# Patient Record
Sex: Female | Born: 1950
Health system: Southern US, Community
[De-identification: ages and names within clinical notes are randomized; demographics above are authoritative.]

## PROBLEM LIST (undated history)

## (undated) DIAGNOSIS — Z8489 Family history of other specified conditions: Secondary | ICD-10-CM

## (undated) DIAGNOSIS — I739 Peripheral vascular disease, unspecified: Secondary | ICD-10-CM

## (undated) DIAGNOSIS — I10 Essential (primary) hypertension: Secondary | ICD-10-CM

## (undated) HISTORY — PX: CHOLECYSTECTOMY: SHX55

---

## 2014-07-16 ENCOUNTER — Emergency Department (HOSPITAL_COMMUNITY)
Admission: EM | Admit: 2014-07-16 | Discharge: 2014-07-16 | Disposition: A | Discharge status: Home / Self Care | Payer: BLUE CROSS/BLUE SHIELD | Source: Home / Self Care | Attending: Emergency Medicine | Admitting: Emergency Medicine

## 2014-07-16 ENCOUNTER — Emergency Department (HOSPITAL_COMMUNITY): Payer: BLUE CROSS/BLUE SHIELD | Admitting: Anesthesiology

## 2014-07-16 ENCOUNTER — Encounter (HOSPITAL_COMMUNITY): Payer: Self-pay | Admitting: Oncology

## 2014-07-16 ENCOUNTER — Inpatient Hospital Stay (HOSPITAL_COMMUNITY)
Admission: EM | Admit: 2014-07-16 | Discharge: 2014-07-21 | DRG: 580 | Disposition: A | Discharge status: Home Health | Payer: BLUE CROSS/BLUE SHIELD | Attending: Vascular Surgery | Admitting: Vascular Surgery

## 2014-07-16 ENCOUNTER — Encounter (HOSPITAL_COMMUNITY)
Admission: EM | Disposition: A | Discharge status: Home Health | Payer: Self-pay | Source: Home / Self Care | Attending: Vascular Surgery

## 2014-07-16 ENCOUNTER — Emergency Department (HOSPITAL_COMMUNITY): Payer: BLUE CROSS/BLUE SHIELD

## 2014-07-16 ENCOUNTER — Other Ambulatory Visit (HOSPITAL_COMMUNITY): Payer: Self-pay

## 2014-07-16 ENCOUNTER — Encounter (HOSPITAL_COMMUNITY): Payer: Self-pay | Admitting: Emergency Medicine

## 2014-07-16 DIAGNOSIS — S70312A Abrasion, left thigh, initial encounter: Secondary | ICD-10-CM

## 2014-07-16 DIAGNOSIS — Y998 Other external cause status: Secondary | ICD-10-CM

## 2014-07-16 DIAGNOSIS — S90511A Abrasion, right ankle, initial encounter: Secondary | ICD-10-CM

## 2014-07-16 DIAGNOSIS — M7981 Nontraumatic hematoma of soft tissue: Secondary | ICD-10-CM | POA: Diagnosis not present

## 2014-07-16 DIAGNOSIS — Z9889 Other specified postprocedural states: Secondary | ICD-10-CM | POA: Diagnosis not present

## 2014-07-16 DIAGNOSIS — S80812A Abrasion, left lower leg, initial encounter: Secondary | ICD-10-CM | POA: Insufficient documentation

## 2014-07-16 DIAGNOSIS — Y9389 Activity, other specified: Secondary | ICD-10-CM | POA: Insufficient documentation

## 2014-07-16 DIAGNOSIS — Y9289 Other specified places as the place of occurrence of the external cause: Secondary | ICD-10-CM | POA: Insufficient documentation

## 2014-07-16 DIAGNOSIS — S81012A Laceration without foreign body, left knee, initial encounter: Secondary | ICD-10-CM

## 2014-07-16 DIAGNOSIS — I10 Essential (primary) hypertension: Secondary | ICD-10-CM | POA: Insufficient documentation

## 2014-07-16 DIAGNOSIS — S7010XA Contusion of unspecified thigh, initial encounter: Secondary | ICD-10-CM | POA: Diagnosis present

## 2014-07-16 DIAGNOSIS — D62 Acute posthemorrhagic anemia: Secondary | ICD-10-CM | POA: Diagnosis present

## 2014-07-16 DIAGNOSIS — S7012XA Contusion of left thigh, initial encounter: Secondary | ICD-10-CM | POA: Diagnosis present

## 2014-07-16 DIAGNOSIS — I959 Hypotension, unspecified: Secondary | ICD-10-CM | POA: Diagnosis present

## 2014-07-16 DIAGNOSIS — S75092A Other specified injury of femoral artery, left leg, initial encounter: Secondary | ICD-10-CM | POA: Diagnosis not present

## 2014-07-16 DIAGNOSIS — Z79899 Other long term (current) drug therapy: Secondary | ICD-10-CM | POA: Diagnosis not present

## 2014-07-16 DIAGNOSIS — W19XXXA Unspecified fall, initial encounter: Secondary | ICD-10-CM

## 2014-07-16 DIAGNOSIS — R58 Hemorrhage, not elsewhere classified: Secondary | ICD-10-CM

## 2014-07-16 DIAGNOSIS — S81022A Laceration with foreign body, left knee, initial encounter: Secondary | ICD-10-CM | POA: Diagnosis present

## 2014-07-16 DIAGNOSIS — T07XXXA Unspecified multiple injuries, initial encounter: Secondary | ICD-10-CM

## 2014-07-16 DIAGNOSIS — S81029A Laceration with foreign body, unspecified knee, initial encounter: Secondary | ICD-10-CM | POA: Diagnosis not present

## 2014-07-16 DIAGNOSIS — I70202 Unspecified atherosclerosis of native arteries of extremities, left leg: Secondary | ICD-10-CM | POA: Diagnosis not present

## 2014-07-16 HISTORY — DX: Essential (primary) hypertension: I10

## 2014-07-16 HISTORY — PX: INSERTION OF ILIAC STENT: SHX6256

## 2014-07-16 LAB — CBC
HCT: 34.6 % — ABNORMAL LOW (ref 36.0–46.0)
HCT: 31.4 % — ABNORMAL LOW (ref 36.0–46.0)
Hemoglobin: 11.7 g/dL — ABNORMAL LOW (ref 12.0–15.0)
Hemoglobin: 10.6 g/dL — ABNORMAL LOW (ref 12.0–15.0)
MCH: 29.8 pg (ref 26.0–34.0)
MCH: 29.9 pg (ref 26.0–34.0)
MCHC: 33.8 g/dL (ref 30.0–36.0)
MCHC: 33.8 g/dL (ref 30.0–36.0)
MCV: 88.3 fL (ref 78.0–100.0)
MCV: 88.5 fL (ref 78.0–100.0)
Platelets: 242 10*3/uL (ref 150–400)
Platelets: 228 10*3/uL (ref 150–400)
RBC: 3.92 MIL/uL (ref 3.87–5.11)
RBC: 3.55 MIL/uL — ABNORMAL LOW (ref 3.87–5.11)
RDW: 13 % (ref 11.5–15.5)
RDW: 13 % (ref 11.5–15.5)
WBC: 13.1 10*3/uL — AB (ref 4.0–10.5)
WBC: 11.4 10*3/uL — ABNORMAL HIGH (ref 4.0–10.5)

## 2014-07-16 LAB — PROTIME-INR
INR: 1.13 (ref 0.00–1.49)
Prothrombin Time: 14.7 seconds (ref 11.6–15.2)

## 2014-07-16 LAB — CBC WITH DIFFERENTIAL/PLATELET
Basophils Absolute: 0 10*3/uL (ref 0.0–0.1)
Basophils Relative: 0 % (ref 0–1)
Eosinophils Absolute: 0 10*3/uL (ref 0.0–0.7)
Eosinophils Relative: 0 % (ref 0–5)
HCT: 37 % (ref 36.0–46.0)
Hemoglobin: 12.3 g/dL (ref 12.0–15.0)
LYMPHS ABS: 1.3 10*3/uL (ref 0.7–4.0)
LYMPHS PCT: 9 % — AB (ref 12–46)
MCH: 29.2 pg (ref 26.0–34.0)
MCHC: 33.2 g/dL (ref 30.0–36.0)
MCV: 87.9 fL (ref 78.0–100.0)
Monocytes Absolute: 1 10*3/uL (ref 0.1–1.0)
Monocytes Relative: 7 % (ref 3–12)
NEUTROS PCT: 84 % — AB (ref 43–77)
Neutro Abs: 12.5 10*3/uL — ABNORMAL HIGH (ref 1.7–7.7)
Platelets: 243 10*3/uL (ref 150–400)
RBC: 4.21 MIL/uL (ref 3.87–5.11)
RDW: 12.7 % (ref 11.5–15.5)
WBC: 14.7 10*3/uL — AB (ref 4.0–10.5)

## 2014-07-16 LAB — CREATININE, SERUM
Creatinine, Ser: 0.74 mg/dL (ref 0.44–1.00)
GFR calc Af Amer: >60 mL/min (ref >60)

## 2014-07-16 LAB — MRSA PCR SCREENING: MRSA BY PCR: NEGATIVE

## 2014-07-16 LAB — BASIC METABOLIC PANEL
ANION GAP: 10 (ref 5–15)
BUN: 18 mg/dL (ref 6–20)
CHLORIDE: 107 mmol/L (ref 101–111)
CO2: 22 mmol/L (ref 22–32)
Calcium: 8.9 mg/dL (ref 8.9–10.3)
Creatinine, Ser: 0.84 mg/dL (ref 0.44–1.00)
GFR calc Af Amer: >60 mL/min (ref >60)
Glucose, Bld: 181 mg/dL — ABNORMAL HIGH (ref 65–99)
POTASSIUM: 3.1 mmol/L — AB (ref 3.5–5.1)
SODIUM: 139 mmol/L (ref 135–145)

## 2014-07-16 SURGERY — INSERTION, STENT, ARTERY, ILIAC
Anesthesia: General | Site: Leg Upper | Laterality: Left

## 2014-07-16 MED ORDER — PROPOFOL 10 MG/ML IV BOLUS
INTRAVENOUS | Status: DC | PRN
  Administered 2014-07-16: 120 mg via INTRAVENOUS

## 2014-07-16 MED ORDER — FLEET ENEMA 7-19 GM/118ML RE ENEM: 1.0000 | ENEMA | Freq: Once | RECTAL | Status: AC | PRN

## 2014-07-16 MED ORDER — CEFAZOLIN SODIUM-DEXTROSE 2-3 GM-% IV SOLR
INTRAVENOUS | Status: AC
  Filled 2014-07-16: qty 50

## 2014-07-16 MED ORDER — PROPOFOL 10 MG/ML IV BOLUS
INTRAVENOUS | Status: AC
  Filled 2014-07-16: qty 20

## 2014-07-16 MED ORDER — SODIUM CHLORIDE 0.9 % IV SOLN
500.0000 mL | Freq: Once | INTRAVENOUS | Status: AC | PRN
  Administered 2014-07-16 (×2): 500 mL via INTRAVENOUS

## 2014-07-16 MED ORDER — PHENOL 1.4 % MT LIQD: 1.0000 | OROMUCOSAL | Status: DC | PRN

## 2014-07-16 MED ORDER — ENOXAPARIN SODIUM 40 MG/0.4ML ~~LOC~~ SOLN
40.0000 mg | SUBCUTANEOUS | Status: DC
  Administered 2014-07-17 – 2014-07-21 (×4): 40 mg via SUBCUTANEOUS
  Filled 2014-07-16 (×6): qty 0.4

## 2014-07-16 MED ORDER — HEPARIN SODIUM (PORCINE) 1000 UNIT/ML IJ SOLN
INTRAMUSCULAR | Status: DC | PRN
  Administered 2014-07-16: 5000 [IU] via INTRAVENOUS

## 2014-07-16 MED ORDER — ROCURONIUM BROMIDE 50 MG/5ML IV SOLN
INTRAVENOUS | Status: AC
  Filled 2014-07-16: qty 1

## 2014-07-16 MED ORDER — IODIXANOL 320 MG/ML IV SOLN
INTRAVENOUS | Status: DC | PRN
  Administered 2014-07-16: 43 mL via INTRAVENOUS

## 2014-07-16 MED ORDER — MAGNESIUM SULFATE 2 GM/50ML IV SOLN
2.0000 g | Freq: Every day | INTRAVENOUS | Status: DC | PRN
  Filled 2014-07-16: qty 50

## 2014-07-16 MED ORDER — LIDOCAINE HCL (CARDIAC) 20 MG/ML IV SOLN
INTRAVENOUS | Status: AC
  Filled 2014-07-16: qty 5

## 2014-07-16 MED ORDER — FENTANYL CITRATE (PF) 100 MCG/2ML IJ SOLN
100.0000 ug | INTRAMUSCULAR | Status: AC
  Administered 2014-07-16: 100 ug via INTRAVENOUS
  Filled 2014-07-16: qty 2

## 2014-07-16 MED ORDER — IOHEXOL 350 MG/ML SOLN
100.0000 mL | Freq: Once | INTRAVENOUS | Status: AC | PRN
  Administered 2014-07-16: 100 mL via INTRAVENOUS

## 2014-07-16 MED ORDER — SODIUM CHLORIDE 0.9 % IV SOLN
Freq: Once | INTRAVENOUS | Status: AC
  Administered 2014-07-16: 06:00:00 via INTRAVENOUS

## 2014-07-16 MED ORDER — SODIUM CHLORIDE 0.9 % IV SOLN
INTRAVENOUS | Status: DC
  Administered 2014-07-16 – 2014-07-17 (×3): via INTRAVENOUS

## 2014-07-16 MED ORDER — HYDRALAZINE HCL 20 MG/ML IJ SOLN: 5.0000 mg | INTRAMUSCULAR | Status: DC | PRN

## 2014-07-16 MED ORDER — BACITRACIN ZINC 500 UNIT/GM EX OINT
TOPICAL_OINTMENT | CUTANEOUS | Status: AC
Start: 2014-07-16 — End: 2014-07-16
  Administered 2014-07-16: 1 via TOPICAL
  Filled 2014-07-16: qty 0.9

## 2014-07-16 MED ORDER — DEXAMETHASONE SODIUM PHOSPHATE 4 MG/ML IJ SOLN
INTRAMUSCULAR | Status: AC
  Filled 2014-07-16: qty 1

## 2014-07-16 MED ORDER — FENTANYL CITRATE (PF) 250 MCG/5ML IJ SOLN
INTRAMUSCULAR | Status: AC
  Filled 2014-07-16: qty 5

## 2014-07-16 MED ORDER — HYDROMORPHONE HCL 1 MG/ML IJ SOLN
INTRAMUSCULAR | Status: AC
  Filled 2014-07-16: qty 1

## 2014-07-16 MED ORDER — ALUM & MAG HYDROXIDE-SIMETH 200-200-20 MG/5ML PO SUSP
15.0000 mL | ORAL | Status: DC | PRN
Start: 2014-07-16 — End: 2014-07-21

## 2014-07-16 MED ORDER — PROTAMINE SULFATE 10 MG/ML IV SOLN
INTRAVENOUS | Status: DC | PRN
  Administered 2014-07-16: 30 mg via INTRAVENOUS

## 2014-07-16 MED ORDER — GUAIFENESIN-DM 100-10 MG/5ML PO SYRP
15.0000 mL | ORAL_SOLUTION | ORAL | Status: DC | PRN
Start: 2014-07-16 — End: 2014-07-21

## 2014-07-16 MED ORDER — ONDANSETRON HCL 4 MG/2ML IJ SOLN
4.0000 mg | Freq: Four times a day (QID) | INTRAMUSCULAR | Status: DC | PRN
  Administered 2014-07-20: 4 mg via INTRAVENOUS
  Filled 2014-07-16: qty 2

## 2014-07-16 MED ORDER — HYDROMORPHONE HCL 1 MG/ML IJ SOLN
1.0000 mg | Freq: Once | INTRAMUSCULAR | Status: AC
  Administered 2014-07-16: 1 mg via INTRAVENOUS

## 2014-07-16 MED ORDER — BACITRACIN ZINC 500 UNIT/GM EX OINT
1.0000 "application " | TOPICAL_OINTMENT | Freq: Two times a day (BID) | CUTANEOUS | Status: DC
  Administered 2014-07-16: 1 via TOPICAL

## 2014-07-16 MED ORDER — ARTIFICIAL TEARS OP OINT
TOPICAL_OINTMENT | OPHTHALMIC | Status: AC
  Filled 2014-07-16: qty 3.5

## 2014-07-16 MED ORDER — ACETAMINOPHEN 650 MG RE SUPP
325.0000 mg | RECTAL | Status: DC | PRN
Start: 2014-07-16 — End: 2014-07-21

## 2014-07-16 MED ORDER — METOPROLOL TARTRATE 1 MG/ML IV SOLN: 2.0000 mg | INTRAVENOUS | Status: DC | PRN

## 2014-07-16 MED ORDER — POLYETHYLENE GLYCOL 3350 17 G PO PACK
17.0000 g | PACK | Freq: Every day | ORAL | Status: DC | PRN
  Filled 2014-07-16: qty 1

## 2014-07-16 MED ORDER — SODIUM CHLORIDE 0.9 % IR SOLN
Status: DC | PRN
  Administered 2014-07-16: 3000 mL

## 2014-07-16 MED ORDER — EPHEDRINE SULFATE 50 MG/ML IJ SOLN
INTRAMUSCULAR | Status: AC
  Filled 2014-07-16: qty 1

## 2014-07-16 MED ORDER — LIDOCAINE HCL (CARDIAC) 20 MG/ML IV SOLN
INTRAVENOUS | Status: DC | PRN
  Administered 2014-07-16: 40 mg via INTRAVENOUS

## 2014-07-16 MED ORDER — SODIUM CHLORIDE 0.9 % IR SOLN
Status: DC | PRN
  Administered 2014-07-16: 500 mL

## 2014-07-16 MED ORDER — ACETAMINOPHEN 325 MG PO TABS
325.0000 mg | ORAL_TABLET | ORAL | Status: DC | PRN
  Administered 2014-07-19: 325 mg via ORAL
  Filled 2014-07-16: qty 2

## 2014-07-16 MED ORDER — FENTANYL CITRATE (PF) 250 MCG/5ML IJ SOLN
INTRAMUSCULAR | Status: DC | PRN
  Administered 2014-07-16 (×2): 50 ug via INTRAVENOUS

## 2014-07-16 MED ORDER — 0.9 % SODIUM CHLORIDE (POUR BTL) OPTIME
TOPICAL | Status: DC | PRN
  Administered 2014-07-16: 2000 mL

## 2014-07-16 MED ORDER — SODIUM CHLORIDE 0.9 % IJ SOLN
INTRAMUSCULAR | Status: AC
  Filled 2014-07-16: qty 10

## 2014-07-16 MED ORDER — HYDROMORPHONE HCL 1 MG/ML IJ SOLN: 0.2500 mg | INTRAMUSCULAR | Status: DC | PRN

## 2014-07-16 MED ORDER — METOPROLOL TARTRATE 25 MG PO TABS
25.0000 mg | ORAL_TABLET | Freq: Two times a day (BID) | ORAL | Status: DC
  Administered 2014-07-16: 25 mg via ORAL
  Filled 2014-07-16 (×4): qty 1

## 2014-07-16 MED ORDER — HYDROMORPHONE HCL 1 MG/ML IJ SOLN
1.0000 mg | Freq: Once | INTRAMUSCULAR | Status: AC
  Administered 2014-07-16: 1 mg via INTRAVENOUS
  Filled 2014-07-16: qty 1

## 2014-07-16 MED ORDER — DEXTROSE 5 % IV SOLN
10.0000 mg | INTRAVENOUS | Status: DC | PRN
  Administered 2014-07-16: 60 ug/min via INTRAVENOUS

## 2014-07-16 MED ORDER — FENTANYL CITRATE (PF) 100 MCG/2ML IJ SOLN
100.0000 ug | Freq: Once | INTRAMUSCULAR | Status: AC
  Administered 2014-07-16: 100 ug via INTRAVENOUS
  Filled 2014-07-16: qty 2

## 2014-07-16 MED ORDER — DOCUSATE SODIUM 100 MG PO CAPS
100.0000 mg | ORAL_CAPSULE | Freq: Every day | ORAL | Status: DC
  Administered 2014-07-17 – 2014-07-21 (×4): 100 mg via ORAL
  Filled 2014-07-16 (×5): qty 1

## 2014-07-16 MED ORDER — MORPHINE SULFATE 2 MG/ML IJ SOLN
2.0000 mg | INTRAMUSCULAR | Status: DC | PRN
  Administered 2014-07-16 – 2014-07-17 (×3): 2 mg via INTRAVENOUS
  Administered 2014-07-17 (×2): 4 mg via INTRAVENOUS
  Administered 2014-07-17 (×2): 2 mg via INTRAVENOUS
  Administered 2014-07-17 – 2014-07-18 (×2): 4 mg via INTRAVENOUS
  Filled 2014-07-16: qty 2
  Filled 2014-07-16: qty 1
  Filled 2014-07-16: qty 2
  Filled 2014-07-16: qty 1
  Filled 2014-07-16: qty 2
  Filled 2014-07-16: qty 1
  Filled 2014-07-16: qty 2
  Filled 2014-07-16 (×2): qty 1

## 2014-07-16 MED ORDER — OXYCODONE HCL 5 MG PO TABS
5.0000 mg | ORAL_TABLET | ORAL | Status: DC | PRN
  Administered 2014-07-18: 5 mg via ORAL
  Administered 2014-07-21: 10 mg via ORAL
  Filled 2014-07-16: qty 2
  Filled 2014-07-16: qty 1

## 2014-07-16 MED ORDER — LACTATED RINGERS IV SOLN
INTRAVENOUS | Status: DC | PRN
  Administered 2014-07-16 (×2): via INTRAVENOUS

## 2014-07-16 MED ORDER — LABETALOL HCL 5 MG/ML IV SOLN
10.0000 mg | INTRAVENOUS | Status: DC | PRN
  Filled 2014-07-16: qty 4

## 2014-07-16 MED ORDER — LISINOPRIL 5 MG PO TABS
5.0000 mg | ORAL_TABLET | Freq: Every day | ORAL | Status: DC
  Administered 2014-07-16: 5 mg via ORAL
  Filled 2014-07-16 (×2): qty 1

## 2014-07-16 MED ORDER — DEXAMETHASONE SODIUM PHOSPHATE 4 MG/ML IJ SOLN
INTRAMUSCULAR | Status: DC | PRN
  Administered 2014-07-16: 8 mg via INTRAVENOUS

## 2014-07-16 MED ORDER — BISACODYL 10 MG RE SUPP: 10.0000 mg | Freq: Every day | RECTAL | Status: DC | PRN

## 2014-07-16 MED ORDER — POTASSIUM CHLORIDE CRYS ER 20 MEQ PO TBCR: 20.0000 meq | EXTENDED_RELEASE_TABLET | Freq: Every day | ORAL | Status: DC | PRN

## 2014-07-16 MED ORDER — LIDOCAINE HCL (PF) 1 % IJ SOLN
2.0000 mL | Freq: Once | INTRAMUSCULAR | Status: AC
  Administered 2014-07-16: 2 mL
  Filled 2014-07-16: qty 5

## 2014-07-16 MED ORDER — ESMOLOL HCL 10 MG/ML IV SOLN
INTRAVENOUS | Status: DC | PRN
  Administered 2014-07-16: 30 mg via INTRAVENOUS

## 2014-07-16 MED ORDER — CEFAZOLIN SODIUM-DEXTROSE 2-3 GM-% IV SOLR
INTRAVENOUS | Status: DC | PRN
  Administered 2014-07-16: 2 g via INTRAVENOUS

## 2014-07-16 MED ORDER — ROCURONIUM BROMIDE 100 MG/10ML IV SOLN
INTRAVENOUS | Status: DC | PRN
  Administered 2014-07-16: 50 mg via INTRAVENOUS

## 2014-07-16 MED ORDER — DEXTROSE 5 % IV SOLN
1.5000 g | Freq: Two times a day (BID) | INTRAVENOUS | Status: AC
  Administered 2014-07-16 – 2014-07-17 (×2): 1.5 g via INTRAVENOUS
  Filled 2014-07-16 (×2): qty 1.5

## 2014-07-16 MED ORDER — ONDANSETRON HCL 4 MG/2ML IJ SOLN
INTRAMUSCULAR | Status: AC
  Filled 2014-07-16: qty 2

## 2014-07-16 MED ORDER — EPHEDRINE SULFATE 50 MG/ML IJ SOLN
INTRAMUSCULAR | Status: DC | PRN
  Administered 2014-07-16: 10 mg via INTRAVENOUS

## 2014-07-16 SURGICAL SUPPLY — 105 items
BAG BANDED W/RUBBER/TAPE 36X54 (MISCELLANEOUS) ×3 IMPLANT
BAG ISOLATION DRAPE 18X18 (DRAPES) ×2 IMPLANT
BALLN MUSTANG 4.0X40 75 (BALLOONS) ×3
BALLN MUSTANG 4X20X135 (BALLOONS) ×3
BALLOON MUSTANG 4.0X40 75 (BALLOONS) ×2 IMPLANT
BALLOON MUSTANG 4X20X135 (BALLOONS) ×2 IMPLANT
BANDAGE ELASTIC 4 VELCRO ST LF (GAUZE/BANDAGES/DRESSINGS) IMPLANT
BANDAGE ESMARK 6X9 LF (GAUZE/BANDAGES/DRESSINGS) IMPLANT
BNDG ESMARK 6X9 LF (GAUZE/BANDAGES/DRESSINGS)
CANISTER SUCTION 2500CC (MISCELLANEOUS) ×3 IMPLANT
CANISTER WOUND CARE 500ML ATS (WOUND CARE) ×3 IMPLANT
CATH ANGIO 5F BER2 100CM (CATHETERS) ×3 IMPLANT
CATH EMB 3FR 80CM (CATHETERS) IMPLANT
CATH EMB 4FR 80CM (CATHETERS) IMPLANT
CATH EMB 5FR 80CM (CATHETERS) IMPLANT
CATH OMNI FLUSH .035X70CM (CATHETERS) ×3 IMPLANT
CATH QUICKCROSS SUPP .035X90CM (MICROCATHETER) ×3 IMPLANT
CLIP TI MEDIUM 24 (CLIP) ×3 IMPLANT
CLIP TI WIDE RED SMALL 24 (CLIP) ×3 IMPLANT
COVER BACK TABLE 60X90IN (DRAPES) ×6 IMPLANT
COVER DOME SNAP 22 D (MISCELLANEOUS) ×3 IMPLANT
COVER PROBE W GEL 5X96 (DRAPES) ×3 IMPLANT
CUFF TOURNIQUET SINGLE 18IN (TOURNIQUET CUFF) IMPLANT
CUFF TOURNIQUET SINGLE 24IN (TOURNIQUET CUFF) IMPLANT
CUFF TOURNIQUET SINGLE 34IN LL (TOURNIQUET CUFF) IMPLANT
CUFF TOURNIQUET SINGLE 44IN (TOURNIQUET CUFF) IMPLANT
DEVICE TORQUE H2O (MISCELLANEOUS) ×3 IMPLANT
DEVICE TORQUE KENDALL .025-038 (MISCELLANEOUS) ×3 IMPLANT
DRAIN CHANNEL 15F RND FF W/TCR (WOUND CARE) IMPLANT
DRAPE C-ARM 42X72 X-RAY (DRAPES) IMPLANT
DRAPE ISOLATION BAG 18X18 (DRAPES) ×1
DRAPE X-RAY CASS 24X20 (DRAPES) IMPLANT
DRSG COVADERM 4X10 (GAUZE/BANDAGES/DRESSINGS) IMPLANT
DRSG COVADERM 4X8 (GAUZE/BANDAGES/DRESSINGS) IMPLANT
DRSG VAC ATS MED SENSATRAC (GAUZE/BANDAGES/DRESSINGS) ×3 IMPLANT
ELECT REM PT RETURN 9FT ADLT (ELECTROSURGICAL) ×3
ELECTRODE REM PT RTRN 9FT ADLT (ELECTROSURGICAL) ×2 IMPLANT
EVACUATOR SILICONE 100CC (DRAIN) IMPLANT
GLOVE BIO SURGEON STRL SZ 6.5 (GLOVE) ×3 IMPLANT
GLOVE BIO SURGEON STRL SZ7 (GLOVE) ×9 IMPLANT
GLOVE BIOGEL PI IND STRL 6.5 (GLOVE) ×14 IMPLANT
GLOVE BIOGEL PI IND STRL 7.5 (GLOVE) ×12 IMPLANT
GLOVE BIOGEL PI INDICATOR 6.5 (GLOVE) ×7
GLOVE BIOGEL PI INDICATOR 7.5 (GLOVE) ×6
GLOVE SURG SS PI 7.5 STRL IVOR (GLOVE) ×9 IMPLANT
GOWN STRL REUS W/ TWL LRG LVL3 (GOWN DISPOSABLE) ×12 IMPLANT
GOWN STRL REUS W/TWL LRG LVL3 (GOWN DISPOSABLE) ×6
GUIDEWIRE ANGLED .035X150CM (WIRE) ×3 IMPLANT
HANDPIECE INTERPULSE COAX TIP (DISPOSABLE) ×1
INSERT FOGARTY SM (MISCELLANEOUS) IMPLANT
KIT BASIN OR (CUSTOM PROCEDURE TRAY) ×3 IMPLANT
KIT ENCORE 26 ADVANTAGE (KITS) ×3 IMPLANT
KIT ROOM TURNOVER OR (KITS) ×3 IMPLANT
LIQUID BAND (GAUZE/BANDAGES/DRESSINGS) ×3 IMPLANT
MARKER GRAFT CORONARY BYPASS (MISCELLANEOUS) IMPLANT
NEEDLE PERC 18GX7CM (NEEDLE) ×3 IMPLANT
NS IRRIG 1000ML POUR BTL (IV SOLUTION) ×6 IMPLANT
PACK PERIPHERAL VASCULAR (CUSTOM PROCEDURE TRAY) ×3 IMPLANT
PAD ARMBOARD 7.5X6 YLW CONV (MISCELLANEOUS) ×6 IMPLANT
PADDING CAST COTTON 6X4 STRL (CAST SUPPLIES) IMPLANT
SET COLLECT BLD 21X3/4 12 (NEEDLE) IMPLANT
SET HNDPC FAN SPRY TIP SCT (DISPOSABLE) ×2 IMPLANT
SET MICROPUNCTURE 5F STIFF (MISCELLANEOUS) ×3 IMPLANT
SHEATH AVANTI 11CM 5FR (MISCELLANEOUS) ×3 IMPLANT
SHEATH AVANTI 11CM 8FR (MISCELLANEOUS) ×3 IMPLANT
SHEATH PINNACLE 6F 10CM (SHEATH) ×3 IMPLANT
SPONGE GAUZE 4X4 12PLY STER LF (GAUZE/BANDAGES/DRESSINGS) ×3 IMPLANT
SPONGE LAP 18X18 X RAY DECT (DISPOSABLE) ×3 IMPLANT
SPONGE SURGIFOAM ABS GEL 100 (HEMOSTASIS) IMPLANT
STAPLER VISISTAT 35W (STAPLE) IMPLANT
STENT VIABAHN 6X50X120 (Permanent Stent) ×3 IMPLANT
STOPCOCK 4 WAY LG BORE MALE ST (IV SETS) IMPLANT
STRIP CLOSURE SKIN 1/2X4 (GAUZE/BANDAGES/DRESSINGS) ×3 IMPLANT
SUT ETHILON 3 0 PS 1 (SUTURE) ×3 IMPLANT
SUT GORETEX 5 0 TT13 24 (SUTURE) IMPLANT
SUT GORETEX 6.0 TT13 (SUTURE) IMPLANT
SUT MNCRL AB 4-0 PS2 18 (SUTURE) ×9 IMPLANT
SUT PROLENE 5 0 C 1 24 (SUTURE) ×3 IMPLANT
SUT PROLENE 6 0 BV (SUTURE) ×3 IMPLANT
SUT PROLENE 7 0 BV 1 (SUTURE) IMPLANT
SUT SILK 2 0 FS (SUTURE) IMPLANT
SUT SILK 3 0 (SUTURE)
SUT SILK 3-0 18XBRD TIE 12 (SUTURE) IMPLANT
SUT VIC AB 2-0 CT1 27 (SUTURE) ×1
SUT VIC AB 2-0 CT1 TAPERPNT 27 (SUTURE) ×2 IMPLANT
SUT VIC AB 3-0 SH 27 (SUTURE) ×1
SUT VIC AB 3-0 SH 27X BRD (SUTURE) ×2 IMPLANT
SYR 3ML LL SCALE MARK (SYRINGE) ×3 IMPLANT
SYRINGE 10CC LL (SYRINGE) ×6 IMPLANT
SYRINGE 20CC LL (MISCELLANEOUS) ×9 IMPLANT
TAPE CLOTH SURG 4X10 WHT LF (GAUZE/BANDAGES/DRESSINGS) ×3 IMPLANT
TAPE VIPERTRACK RADIOPAQ 30X (MISCELLANEOUS) ×2 IMPLANT
TAPE VIPERTRACK RADIOPAQUE (MISCELLANEOUS) ×1
TOWEL OR 17X24 6PK STRL BLUE (TOWEL DISPOSABLE) ×3 IMPLANT
TRAY FOLEY CATH 16FRSI W/METER (SET/KITS/TRAYS/PACK) ×3 IMPLANT
TUBING EXTENTION W/L.L. (IV SETS) IMPLANT
UNDERPAD 30X30 INCONTINENT (UNDERPADS AND DIAPERS) ×3 IMPLANT
WATER STERILE IRR 1000ML POUR (IV SOLUTION) ×3 IMPLANT
WIRE AMPLATZ SS-J .035X180CM (WIRE) ×3 IMPLANT
WIRE BENTSON .035X145CM (WIRE) ×3 IMPLANT
WIRE ROSEN 145CM (WIRE) ×3 IMPLANT
WIRE ROSEN-J .035X180CM (WIRE) ×3 IMPLANT
WIRE SPARTACORE .014X190CM (WIRE) ×3 IMPLANT
WIRE SPARTACORE .014X300CM (WIRE) ×3 IMPLANT
YANKAUER SUCT BULB TIP NO VENT (SUCTIONS) ×3 IMPLANT

## 2014-07-16 NOTE — ED Notes (Signed)
Dorsal pedalis pulse still moderate, Swelling and firmness still noted and appears to be increasing a moving up leg. Large amount of bruising present.

## 2014-07-16 NOTE — ED Notes (Signed)
Vascular paged. 

## 2014-07-16 NOTE — Anesthesia Preprocedure Evaluation (Addendum)
Anesthesia Evaluation  Patient identified by MRN, date of birth, ID band Patient awake    Reviewed: Allergy & Precautions, H&P , NPO status , Patient's Chart, lab work & pertinent test results, reviewed documented beta blocker date and time   Airway Mallampati: III  TM Distance: >3 FB Neck ROM: Full    Dental no notable dental hx. (+) Teeth Intact, Dental Advisory Given   Pulmonary neg pulmonary ROS,  breath sounds clear to auscultation  Pulmonary exam normal       Cardiovascular hypertension, Pt. on medications and Pt. on home beta blockers Rhythm:Regular Rate:Normal     Neuro/Psych negative neurological ROS  negative psych ROS   GI/Hepatic negative GI ROS, Neg liver ROS,   Endo/Other  negative endocrine ROS  Renal/GU negative Renal ROS  negative genitourinary   Musculoskeletal   Abdominal   Peds  Hematology negative hematology ROS (+)   Anesthesia Other Findings   Reproductive/Obstetrics negative OB ROS                           Anesthesia Physical Anesthesia Plan  ASA: II and emergent  Anesthesia Plan: General   Post-op Pain Management:    Induction: Intravenous  Airway Management Planned: Oral ETT  Additional Equipment:   Intra-op Plan:   Post-operative Plan: Extubation in OR  Informed Consent: I have reviewed the patients History and Physical, chart, labs and discussed the procedure including the risks, benefits and alternatives for the proposed anesthesia with the patient or authorized representative who has indicated his/her understanding and acceptance.   Dental advisory given  Plan Discussed with: CRNA  Anesthesia Plan Comments:        Anesthesia Quick Evaluation
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
## 2014-07-16 NOTE — ED Notes (Signed)
Pt appears to be more at eased but still is in a lot of pain. Will continue to monitor.

## 2014-07-16 NOTE — Anesthesia Procedure Notes (Signed)
Procedure Name: Intubation Date/Time: 07/16/2014 9:45 AM Performed by: Alanda AmassFRIEDMAN, Nikyla Navedo A Pre-anesthesia Checklist: Patient identified, Emergency Drugs available, Suction available, Patient being monitored and Timeout performed Patient Re-evaluated:Patient Re-evaluated prior to inductionOxygen Delivery Method: Circle system utilized Preoxygenation: Pre-oxygenation with 100% oxygen Intubation Type: IV induction Ventilation: Mask ventilation without difficulty Laryngoscope Size: Glidescope Tube type: Oral Number of attempts: 1 Airway Equipment and Method: Stylet and Video-laryngoscopy Placement Confirmation: ETT inserted through vocal cords under direct vision,  breath sounds checked- equal and bilateral and positive ETCO2 Secured at: 20 cm Tube secured with: Tape Dental Injury: Teeth and Oropharynx as per pre-operative assessment

## 2014-07-16 NOTE — ED Notes (Signed)
[facility] d/t possible arterial bleed to LL extremity.

## 2014-07-16 NOTE — ED Notes (Addendum)
Pt in severe pain after returning from CT. New order for pain meds obtained. Pt tearful and tachycardic.

## 2014-07-16 NOTE — ED Notes (Signed)
Vascular at bedside

## 2014-07-16 NOTE — Anesthesia Postprocedure Evaluation (Signed)
  Anesthesia Post-op Note  Patient: Pamela LovettRebecca Perez  Procedure(s) Performed: Procedure(s): Left Leg Angiogram, Stenting of Left Superficial Femoral Artery, Repair of Pre-Patella Laceration, Evacuation of Left Thigh Hematoma (Left)  Patient Location: PACU  Anesthesia Type:General  Level of Consciousness: awake and alert   Airway and Oxygen Therapy: Patient Spontanous Breathing  Post-op Pain: none  Post-op Assessment: Post-op Vital signs reviewed, Patient's Cardiovascular Status Stable and Respiratory Function Stable  Post-op Vital Signs: Reviewed  Filed Vitals:   07/16/14 1301  BP: 124/64  Pulse: 97  Temp:   Resp: 12    Complications: No apparent anesthesia complications

## 2014-07-16 NOTE — ED Notes (Signed)
Bed: [id number] Expected date:  Expected time:  Means of arrival:  Comments: EMS swollen upper left leg; seen yesterday and has stitches; increase in swelling

## 2014-07-16 NOTE — Discharge Instructions (Signed)
Abrasions An abrasion is a cut or scrape of the skin. Abrasions do not go through all layers of the skin. HOME CARE  If a bandage (dressing) was put on your wound, change it as told by your doctor. If the bandage sticks, soak it off with warm.  Wash the area with water and soap 2 times a day. Rinse off the soap. Pat the area dry with a clean towel.  Put on medicated cream (ointment) as told by your doctor.  Change your bandage right away if it gets wet or dirty.  Only take medicine as told by your doctor.  See your doctor within 24-48 hours to get your wound checked.  Check your wound for redness, puffiness (swelling), or yellowish-white fluid (pus). GET HELP RIGHT AWAY IF:   You have more pain in the wound.  You have redness, swelling, or tenderness around the wound.  You have pus coming from the wound.  You have a fever or lasting symptoms for more than 2-3 days.  You have a fever and your symptoms suddenly get worse.  You have a bad smell coming from the wound or bandage. MAKE SURE YOU:   Understand these instructions.  Will watch your condition.  Will get help right away if you are not doing well or get worse. Document Released: 07/23/2007 Document Revised: 10/29/2011 Document Reviewed: 01/07/2011 [facility] Patient Information 2015 [employer] [place]. This information is not intended to replace advice given to you by your health care provider. Make sure you discuss any questions you have with your health care provider. Your sutures should be removed in 10 days please read the knee sleeve for support for the next several days to allow the sutures to keep the skin in place for healing
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
## 2014-07-16 NOTE — Transfer of Care (Signed)
Immediate Anesthesia Transfer of Care Note  Patient: Pamela LovettRebecca Perez  Procedure(s) Performed: Procedure(s): Left Leg Angiogram, Stenting of Left Superficial Femoral Artery, Repair of Pre-Patella Laceration, Evacuation of Left Thigh Hematoma (Left)  Patient Location: PACU  Anesthesia Type:General  Level of Consciousness: sedated  Airway & Oxygen Therapy: Patient Spontanous Breathing and Patient connected to nasal cannula oxygen  Post-op Assessment: Report given to RN and Post -op Vital signs reviewed and stable  Post vital signs: Reviewed and stable  Last Vitals:  Filed Vitals:   07/16/14 0845  BP: 94/63  Pulse: 95  Temp:   Resp: 9    Complications: No apparent anesthesia complications
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
## 2014-07-16 NOTE — Op Note (Signed)
OPERATIVE NOTE   PROCEDURE: 1. Right common femoral artery cannulation with ultrasound guidance 2. Second order arterial selection 3. Left leg angiogram 4. Angioplasty and stenting Left superficial femoral artery (Viabahn 6 mm x 50 mm) 5. Washout of pre-patellar laceration 6. Simple repair of pre-patellar laceration 7. Incision and drainage of left thigh hematoma 8. Placement of negative pressure dressing in left thigh hematoma cavity  PRE-OPERATIVE DIAGNOSIS: traumatic hematoma of left distal thigh with imminent skin necrosis, possible left superficial femoral artery injury  POST-OPERATIVE DIAGNOSIS: same as above   SURGEON: Leonides SakeBrian Olayinka Gathers, MD  ANESTHESIA: general  ESTIMATED BLOOD LOSS: 50 cc  CONTRAST: 43 cc  FINDING(S): 1.  Intact left superficial femoral artery with distal superficial femoral artery diffuse stenosis 2.  Delayed filling consistent with likely arteriovenous fistula: exact location of fistula unclear, resolved after stenting 3.  Distal 1/3 segment of superficial femoral artery with multiple branches corresponding to branches feeding hematoma on CTA: mostly resolved after stenting, one branch still filling via collaterals, resolution of arteriovenous fistula on completion images 4.  Blistering and weeping from left medial distal thigh 4. Large hematoma cavity involving ~50% of circumference of distal right thigh: 200-300 cc of hematoma evacuated 5. No active bleeding present at end of case  SPECIMEN(S):  none  INDICATIONS:   Pamela LovettRebecca Perez is a 64 y.o. female who presents >6 hours after falling out a golf cart which resulted initially in a left prepatellar laceration which was repaired in the ED.  She then progressed to developed a large hematoma in the left distal thigh.  A CTA that was completed which demonstrated possible superficial femoral artery branches feeding the hematoma.  The skin overlying the hematoma was tense and ischemic in appearance, so I  felt immediately intervention was indicated.  We discussed: LLE angiogram, possible stenting of the left superficial femoral artery, possible femoropopliteal bypass, and evacuation of hematoma.  The patient and family are aware risk include but are not limited to: bleeding, infection, nerve injury, need for additional procedures included vascular reconstruction and debridement of subsequent ischemic tissue, myocardial infarction and stroke.  I discussed with the patient the nature of angiographic procedures, especially the limited patencies of any endovascular intervention. The patient is aware of that the risks of an angiographic procedure include but are not limited to: bleeding, infection, access site complications, renal failure, embolization, rupture of vessel, dissection, possible need for emergent surgical intervention, possible need for surgical procedures to treat the patient's pathology, anaphylactic reaction to contrast, and stroke and death  DESCRIPTION: After obtaining full informed written consent, the patient was brought back to the operating room and placed supine upon the operating table.  The patient received IV antibiotics prior to induction.  After obtaining adequate anesthesia, the patient was prepped and draped in the standard fashion for: right leg vein harvest and left leg femoropopliteal bypass.  At this point, attention was turned to the right groin.  Under ultrasound guidance, the subcutaneous tissue surrounding the right common femoral artery was anesthesized with 1% lidocaine with epinephrine.  I made a small stab incision as I intended to use a Proglide closure at the end of this portion of the case.  The artery was then cannulated with a micropuncture needle.  The wire would not advance smooth though.  So I removed the needle and wire and held pressure for a few minutes.  I tried again with a 18-gauge needle.  Again the wire would not advance.  I removed the  wire and needle and  held pressure for 5 minutes.  I then cannulated the right common femoral artery more proximally with a micropuncture needle.  The microwire was advanced into the iliac arterial system.  The needle was exchanged for a microsheath, which was loaded into the common femoral artery over the wire.  The microwire was exchanged for a [facility] wire which was advanced into the aorta.  The microsheath was then exchanged for a 5-Fr sheath which was loaded into the common femoral artery.  The Omniflush catheter was then loaded over the wire up to the level of aortic bifurcation.  The [facility] wire was replaced in the catheter, and using the [place] and Omniflush catheter, the left common iliac artery was selected.  The catheter and wire were advanced into the external iliac artery.  I then did a hand injection to image the common femoral artery and proximal superficial femoral artery and profunda femoral artery.  These arteries were widely patent.  I then loaded a Quickcross catheter over the wire.  Using a Glidewire and this catheter, I got into the left superficial femoral artery.  I completed imaging the entire left superficial femoral artery in stations.  These images demonstrated a delayed arteriovenous fistula without obvious extravasation from any of the superficial femoral artery branches present.  I could identify the clustering of branches in the distal 1/3 of the superficial femoral artery which appeared to feed the hematoma on CTA.  Given the presence of a delayed arteriovenous fistula, I suspect a muscular branch injury with subsequent arteriovenous fistula formation in the hematoma.  I elected to cover this clustering of branches to prevent further bleeding into the hematoma.  The wire was exchanged for a Rosen wire.  The right sheath was exchanged for a long 6-Fr Pinnacle sheath, which was placed into the left superficial femoral artery.   The patient was given 5000 units of Heparin intravenously, which was a  therapeutic bolus.  I then exchanged the wire for a long 0.014" Spartacore wire.  A 6 mm x 50 mm Viabahn stent was selected.  I loaded this stent through the sheath and positioned it centered on the segment with the superficial femoral artery branches.  I deployed this stent with adequate overlap.  I post-dilated this stent with a 4 mm x 20 mm balloon.  This appeared to be a good size match for the native artery.  I did not want to risk rupturing this segment so I did not post-dilate to 6 mm.  I removed the wire and balloon and pulled the sheath back into the right external iliac artery.  The sheath was aspirated.  No clots were present and the sheath was reloaded with heparinized saline.  Due to the difficulty cannulating this right common femoral artery due to plaque, I elected to not close this right common femoral artery with a closure device due to higher risk of failure.  I gave the patient 30 mg of Protamine to reverse the heparin.  After waiting a few minutes, I pulled the sheath and held pressure to the right common femoral artery for 15 minutes.  There was no further bleeding and there continued to be a right common femoral artery pulse.  A U-stitch was placed in the right groin stab incision with a 4-0 Monocryl.  The skin was cleaned, dried, and reinforced with Dermabond.  At this point, I removed the prior sutures in the prepatellar laceration.  On the CTA, there was residual debris from  the Emergency Room closure of the prepatellar laceration.  I washed out this wound with 3 L of normal saline using a Pulsavac.  The wound was clean without residual debris.  There appeared to be some subcutaneous tissue overlying the patella, so the bone was not directly involved.  I reapproximated some of the subcutaneous tissue over the patella.  The skin was loosely reapproximated with simple sutures of 3-0 Nylon to allow drainage of any residual infection.  A sterile bandage was applied.  At this point , I made  an incision overlying Hunter's canal.  Immediately, I entered into a hematoma cavity.  This cavity was superficial to the fascia.  I extended the incision to allow complete evacuation of hematoma.  I evacuated ~200-300 cc of acute clot from a cavity which essentially spanned 50% of the circumference of the right distal thigh.  I packed the cavity with dressings and then washed it out.  A few subcutaneous bleeding points were controlled with electrocautery.  There was no further active bleeding.  The skin in the  medial, posterior skin flap was attenuated, suggest likely devitalization.  There was blistering this section of the skin.  I fashioned a VAC sponge for the dimensions of this incision.  I affixed this sponge with adhesive strips.  I cut a hole in the center of the dressing and affixed a lilypad to this sponge.  The VAC hose was connected to the VAC pump and set to 125 continuous.  The dressing became adherent without leak.    COMPLICATIONS: none  CONDITION: stable   Leonides Sake, MD [facility] [employer] Office: [phone] Pager: [phone]  07/16/2014, 12:35 PM

## 2014-07-16 NOTE — H&P (Addendum)
[facility]:  [facility]  Reason for referral: Left thigh hematoma w/ bleeding from SFA  History of Present Illness  Pamela LovettRebecca Perez is a 64 y.o. (11/06/1950) female who presents with chief complaint: left leg pain.  Pt fell out of a golf cart ~11 pm 07/15/14.  [facility] and found initially to have a laceration of the knee without left thigh hematoma.  She was sent home but within 1-2 hours developed swelling and severe pain in left thigh.  [facility] early in the AM.  CTA LLE was done which was read as possible bleeding in L thigh from SFA branches.  The patient notes some skin numbness and severe pain with movement of left leg.  She denies any neuro sx in L foot.  Last PO intake was 1 pm yesterday and she denies any medical problems other than HTN.  Past Medical History  Diagnosis Date  . Hypertension     Past Surgical History  Procedure Laterality Date  . Cholecystectomy      History   Social History  . Marital Status: Married    Spouse Name: N/A  . Number of Children: N/A  . Years of Education: N/A   Occupational History  . Not on file.   Social History Main Topics  . Smoking status: Never Smoker   . Smokeless tobacco: Not on file  . Alcohol Use: No  . Drug Use: No  . Sexual Activity: Not on file   Other Topics Concern  . Not on file   Social History Narrative   Family History: father had cardiac disease  No current facility-administered medications for this encounter.   Current Outpatient Prescriptions  Medication Sig Dispense Refill  . LISINOPRIL PO Take 1 tablet by mouth daily.    Marland Kitchen. METOPROLOL TARTRATE PO Take 1 tablet by mouth daily.     . naproxen sodium (ANAPROX) 220 MG tablet Take 220 mg by mouth 2 (two) times daily as needed (for pain.).     Facility-Administered Medications Ordered in Other Encounters  Medication Dose Route Frequency Provider Last Rate Last Dose  . HYDROmorphone (DILAUDID) 1 MG/ML  injection              No Known Allergies   REVIEW OF SYSTEMS:  (Positives checked otherwise negative)  CARDIOVASCULAR:  []  chest pain, []  chest pressure, []  palpitations, []  shortness of breath when laying flat, []  shortness of breath with exertion,  [x]  pain in feet when walking, [x]  pain in feet when laying flat, []  history of blood clot in veins (DVT), []  history of phlebitis, [x]  swelling in legs, []  varicose veins  PULMONARY:  []  productive cough, []  asthma, []  wheezing  NEUROLOGIC:  []  weakness in arms or legs, []  numbness in arms or legs, []  difficulty speaking or slurred speech, []  temporary loss of vision in one eye, []  dizziness  HEMATOLOGIC:  []  bleeding problems, []  problems with blood clotting too easily  MUSCULOSKEL:  [x]  joint pain, []  joint swelling  GASTROINTEST:  []  vomiting blood, []  blood in stool     GENITOURINARY:  []  burning with urination, []  blood in urine  PSYCHIATRIC:  []  history of major depression  INTEGUMENTARY:  []  rashes, []  ulcers  CONSTITUTIONAL:  []  fever, []  chills   For VQI Use Only  PRE-ADM LIVING: Home  AMB STATUS: Ambulatory  CAD Sx: None  PRIOR CHF: None  STRESS  TEST: [x]  No, [ ]  Normal, [ ]  + ischemia, [ ]  + MI, [ ]  Both   Physical Examination  Filed Vitals:   07/16/14 0723 07/16/14 0822 07/16/14 0830 07/16/14 0845  BP: 139/73 175/52 128/77 94/63  Pulse: 108 93 84 95  Temp: 97.9 F (36.6 C) 97.5 F (36.4 C)    TempSrc: Oral Oral    Resp: 18 15 7 9   Height:  4\' 11"  (1.499 m)    Weight:  145 lb (65.772 kg)    SpO2: 100% 97% 99% 99%    Body mass index is 29.27 kg/(m^2).  General: A&O x 3, WDWN  Head: /AT  Ear/Nose/Throat: Hearing grossly intact, nares w/o erythema or drainage, oropharynx w/o Erythema/Exudate, Mallampati score: 3  Eyes: PERRLA, EOMI  Neck: Supple, no nuchal rigidity, no palpable LAD  Pulmonary: Sym exp, good air movt, CTAB, no rales, rhonchi, & wheezing  Cardiac: RRR, Nl S1, S2, no  Murmurs, rubs or gallops  Vascular: Vessel Right Left  Radial Palpable Palpable  Brachial Palpable Palpable  Carotid Palpable, without bruit Palpable, without bruit  Aorta Not palpable N/A  Femoral Palpable Palpable  Popliteal Not palpable Not palpable  PT Faintly Palpable Faintly Palpable  DP  Not Palpable Not Palpable   Gastrointestinal: soft, NTND, -G/R, - HSM, - masses, - CVAT BN, + AAA ,   Musculoskeletal: M/S 5/5 throughout except LLE limited testing due to pain, Extremities without ischemic changes , large tense hematoma L medial distal thigh with pallorous skin, prepatellar laceration with sutures in place and dressing   Neurologic: CN 2-12 intact , Pain and light touch intact in distal extremities , Motor exam as listed above  Psychiatric: Judgment intact, Mood & affect appropriate for pt's clinical situation  Dermatologic: See M/S exam for extremity exam, no rashes otherwise noted  Lymph : No Cervical, Axillary, or Inguinal lymphadenopathy   Laboratory: CBC:    Component Value Date/Time   WBC 14.7* 07/16/2014 0539   RBC 4.21 07/16/2014 0539   HGB 12.3 07/16/2014 0539   HCT 37.0 07/16/2014 0539   PLT 243 07/16/2014 0539   MCV 87.9 07/16/2014 0539   MCH 29.2 07/16/2014 0539   MCHC 33.2 07/16/2014 0539   RDW 12.7 07/16/2014 0539   LYMPHSABS 1.3 07/16/2014 0539   MONOABS 1.0 07/16/2014 0539   EOSABS 0.0 07/16/2014 0539   BASOSABS 0.0 07/16/2014 0539    BMP:    Component Value Date/Time   NA 139 07/16/2014 0539   K 3.1* 07/16/2014 0539   CL 107 07/16/2014 0539   CO2 22 07/16/2014 0539   GLUCOSE 181* 07/16/2014 0539   BUN 18 07/16/2014 0539   CREATININE 0.84 07/16/2014 0539   CALCIUM 8.9 07/16/2014 0539   GFRNONAA >60 07/16/2014 0539   GFRAA >60 07/16/2014 0539    Coagulation: No results found for: INR, PROTIME No results found for: PTT   Radiology: Ct Angio Low Extrem Left W/cm &/or Wo/cm  07/16/2014   CLINICAL DATA:  Swelling after trauma.  EXAM:  CT ANGIOGRAPHY LOWER LEFT EXTREMITY  TECHNIQUE: Arterially timed CTA of the left lower extremity was performed after intravenous administration of iodinated contrast.  CONTRAST:  OMNIPAQUE IOHEXOL 350 MG/ML SOLN  COMPARISON:  None.  FINDINGS: Standard arterial anatomy in the left lower extremity. There is no major vessel occlusion, flow limiting stenosis, or dissection. No impediment to angiography.  In the medial distal thigh is a 11 x 3.5 x 12 cm hematoma with 2 sites of active arterial  hemorrhage, separated by approximately 10 cm. The proximal most active hemorrhage vessel is visible on coronal imaging as a second order branch from the SFA. The branch point of the smaller and more inferior injured artery is not identified.  As noted previously there is a large laceration over the patella with dissecting gas. Compared to previous radiography, debris is stable to decreased, with foreign bodies measuring up to 1-2 mm at the upper patella. No acute fracture. No joint effusion.  Critical Value/emergent results were called by telephone at the time of interpretation on 07/16/2014 at 6:57 am to Dr. Paula Libra , who verbally acknowledged these results.  Review of the MIP images confirms the above findings.  IMPRESSION: 1. 12 cm hematoma in the subcutaneous medial and distal thigh with 2 sites of active arterial hemorrhage. Further description above. 2. Prepatellar laceration with debris.   Electronically Signed   By: Marnee Spring M.D.   On: 07/16/2014 07:06   Dg Knee Complete 4 Views Left  07/16/2014   CLINICAL DATA:  Thrown from golf cart with left knee injury and laceration. Initial encounter.  EXAM: LEFT KNEE - COMPLETE 4+ VIEW  COMPARISON:  None.  FINDINGS: There is soft tissue gas to the anterior knee consistent with laceration. Scattered radiopaque debris, measuring up to 3 mm. No evidence of fracture or dislocation. No joint effusion.  Mild marginal spurring without joint narrowing.  Chronic periosteal  change along the proximal fibular diaphysis, likely posttraumatic.  IMPRESSION: 1. Prepatellar laceration with debris measuring up to 3 mm. 2. No acute osseous findings.   Electronically Signed   By: Marnee Spring M.D.   On: 07/16/2014 01:45   I reviewed the CTA and there is a large hematoma in the distal medial thigh.  There are multiple branches from the SFA in the distal 1/3 which appear to be coursing through the hematoma.  There is no evidence of active extravasation on this exam, but likely an injury from on these branches is feeding the hematoma.   Medical Decision Making  Daryl Quiros is a 64 y.o. female who presents with: fall with soft tissue injury to left medial thigh with associated SFA muscular branch injury, compromised right medial thigh skin due to large thigh hematoma, residual debris in prepatellar incision   Doubt the SFA is injured based on CTA, rather possible laceration or transection of muscular branch due to soft tissue trauma.  Regardless, the hematoma needs to be drained to try to minimize the amount of skin necrosis.  I have already warned the patient and family that there may be some necrosis of the medial thigh skin already.  In my experience, surgical explorations in this setting usually are bloody messes with increased chance of injury of the nerve trunks, so will perform: LLE angiogram first to try to identify any main SFA trunk injury.  If there is an injury, a L SFA to AK vs BK popliteal bypass maybe needed.  If there is no SFA injury but persistent bleeding distally in the arterial trunk, may need to cover the SFA segment feeding these branches with a covered stent.    At the end, I will plan on draining the hematoma and partial release of fascia as needed.  The patient and family are aware risk include but are not limited to: bleeding, infection, nerve injury, need for additional procedures included vascular reconstruction and debridement of subsequent  ischemic tissue, myocardial infarction and stroke. I discussed with the patient the nature of angiographic  procedures, especially the limited patencies of any endovascular intervention.  The patient is aware of that the risks of an angiographic procedure include but are not limited to: bleeding, infection, access site complications, renal failure, embolization, rupture of vessel, dissection, possible need for emergent surgical intervention, possible need for surgical procedures to treat the patient's pathology, anaphylactic reaction to contrast, and stroke and death.    The patient and family agree to proceed.  Thank you for allowing [name] to participate in this patient's care.  Leonides Sake, MD [facility] [place] Office: [phone] Pager: [phone]  07/16/2014, 9:10 AM

## 2014-07-17 ENCOUNTER — Inpatient Hospital Stay (HOSPITAL_COMMUNITY): Payer: BLUE CROSS/BLUE SHIELD

## 2014-07-17 DIAGNOSIS — Z9889 Other specified postprocedural states: Secondary | ICD-10-CM

## 2014-07-17 LAB — CBC
HCT: 25.1 % — ABNORMAL LOW (ref 36.0–46.0)
Hemoglobin: 8.5 g/dL — ABNORMAL LOW (ref 12.0–15.0)
MCH: 30.1 pg (ref 26.0–34.0)
MCHC: 33.9 g/dL (ref 30.0–36.0)
MCV: 89 fL (ref 78.0–100.0)
Platelets: 192 10*3/uL (ref 150–400)
RBC: 2.82 MIL/uL — ABNORMAL LOW (ref 3.87–5.11)
RDW: 13.1 % (ref 11.5–15.5)
WBC: 10.3 10*3/uL (ref 4.0–10.5)

## 2014-07-17 LAB — BASIC METABOLIC PANEL
Anion gap: 5 (ref 5–15)
BUN: 8 mg/dL (ref 6–20)
CALCIUM: 7.7 mg/dL — AB (ref 8.9–10.3)
CHLORIDE: 110 mmol/L (ref 101–111)
CO2: 25 mmol/L (ref 22–32)
CREATININE: 0.75 mg/dL (ref 0.44–1.00)
GFR calc Af Amer: >60 mL/min (ref >60)
GFR calc non Af Amer: >60 mL/min (ref >60)
Glucose, Bld: 133 mg/dL — ABNORMAL HIGH (ref 65–99)
POTASSIUM: 4.1 mmol/L (ref 3.5–5.1)
SODIUM: 140 mmol/L (ref 135–145)

## 2014-07-17 LAB — CK TOTAL AND CKMB (NOT AT ARMC)
CK, MB: 4.2 ng/mL (ref 0.5–5.0)
Relative Index: INVALID (ref 0.0–2.5)
Total CK: 74 U/L (ref 38–234)

## 2014-07-17 LAB — HEMOGLOBIN AND HEMATOCRIT, BLOOD
HCT: 31.9 % — ABNORMAL LOW (ref 36.0–46.0)
Hemoglobin: 10.8 g/dL — ABNORMAL LOW (ref 12.0–15.0)

## 2014-07-17 LAB — TROPONIN I
Troponin I: 0.3 ng/mL — ABNORMAL HIGH (ref <0.031)
Troponin I: 0.15 ng/mL — ABNORMAL HIGH (ref <0.031)
Troponin I: 0.08 ng/mL — ABNORMAL HIGH (ref <0.031)

## 2014-07-17 LAB — PREPARE RBC (CROSSMATCH)

## 2014-07-17 LAB — ABO/RH: ABO/RH(D): AB POS

## 2014-07-17 MED ORDER — SODIUM CHLORIDE 0.9 % IV SOLN
Freq: Once | INTRAVENOUS | Status: AC
  Administered 2014-07-17: 02:00:00 via INTRAVENOUS

## 2014-07-17 MED ORDER — SODIUM CHLORIDE 0.9 % IV BOLUS (SEPSIS)
1000.0000 mL | Freq: Once | INTRAVENOUS | Status: AC
  Administered 2014-07-17: 1000 mL via INTRAVENOUS

## 2014-07-17 MED ORDER — LISINOPRIL-HYDROCHLOROTHIAZIDE 20-12.5 MG PO TABS: 0.5000 | ORAL_TABLET | Freq: Every day | ORAL | Status: DC

## 2014-07-17 MED ORDER — LISINOPRIL 20 MG PO TABS
20.0000 mg | ORAL_TABLET | Freq: Every day | ORAL | Status: DC
  Administered 2014-07-18 – 2014-07-21 (×3): 20 mg via ORAL
  Filled 2014-07-17 (×5): qty 1

## 2014-07-17 MED ORDER — METOPROLOL TARTRATE 25 MG PO TABS: 25.0000 mg | ORAL_TABLET | Freq: Two times a day (BID) | ORAL | Status: DC

## 2014-07-17 MED ORDER — SODIUM CHLORIDE 0.9 % IV SOLN
Freq: Once | INTRAVENOUS | Status: AC
  Administered 2014-07-17: 06:00:00 via INTRAVENOUS

## 2014-07-17 MED ORDER — HYDROCHLOROTHIAZIDE 12.5 MG PO CAPS
12.5000 mg | ORAL_CAPSULE | Freq: Every day | ORAL | Status: DC
  Administered 2014-07-18 – 2014-07-21 (×3): 12.5 mg via ORAL
  Filled 2014-07-17 (×5): qty 1

## 2014-07-17 MED ORDER — METOPROLOL TARTRATE 25 MG PO TABS
25.0000 mg | ORAL_TABLET | Freq: Two times a day (BID) | ORAL | Status: DC
  Administered 2014-07-18: 25 mg via ORAL
  Filled 2014-07-17 (×4): qty 1

## 2014-07-17 NOTE — Progress Notes (Signed)
VASCULAR LAB PRELIMINARY  ARTERIAL  ABI completed: Right ABI in normal range. Left ABI in normal range with PT pressure and diminished to mild disease with AT pressure.    RIGHT    LEFT    PRESSURE WAVEFORM  PRESSURE WAVEFORM  BRACHIAL 119 Tri BRACHIAL N/A IV site   DP   DP    AT 112 Tri AT 74 Bi  PT 111 Tri PT 102 Tri  PER   PER    GREAT TOE  NA GREAT TOE  NA    RIGHT LEFT  ABI 1.03 0.94    Farrel DemarkJill Eunice, RDMS, RVT  07/17/2014, 11:09 AM

## 2014-07-17 NOTE — Progress Notes (Signed)
BP still low after 1 unit blood transfused.  Dr. Imogene Burnhen notified.  New orders for another transfusion given.  Will continue to monitor.

## 2014-07-17 NOTE — Progress Notes (Addendum)
Progress Note    07/17/2014 7:16 AM 1 Day Post-Op  Subjective:  C/o swelling in left leg  Afebrile HR 60's-80's NSR 80's this am (90's-110's yesterday) systolic 99% RA  Filed Vitals:   07/17/14 0650  BP: 87/44  Pulse: 71  Temp: 97.9 F (36.6 C)  Resp: 15    Physical Exam: Cardiac:  regluar Lungs:  Non labored Incisions:  Right groin is soft without hematoma; left leg wound with vac and it has a good seal; left knee laceration c/d/i with nylon sutures in place Extremities:  2+ left DP pulse   CBC    Component Value Date/Time   WBC 10.3 07/17/2014 0111   RBC 2.82* 07/17/2014 0111   HGB 8.5* 07/17/2014 0111   HCT 25.1* 07/17/2014 0111   PLT 192 07/17/2014 0111   MCV 89.0 07/17/2014 0111   MCH 30.1 07/17/2014 0111   MCHC 33.9 07/17/2014 0111   RDW 13.1 07/17/2014 0111   LYMPHSABS 1.3 07/16/2014 0539   MONOABS 1.0 07/16/2014 0539   EOSABS 0.0 07/16/2014 0539   BASOSABS 0.0 07/16/2014 0539    BMET    Component Value Date/Time   NA 140 07/17/2014 0111   K 4.1 07/17/2014 0111   CL 110 07/17/2014 0111   CO2 25 07/17/2014 0111   GLUCOSE 133* 07/17/2014 0111   BUN 8 07/17/2014 0111   CREATININE 0.75 07/17/2014 0111   CALCIUM 7.7* 07/17/2014 0111   GFRNONAA >60 07/17/2014 0111   GFRAA >60 07/17/2014 0111    INR    Component Value Date/Time   INR 1.13 07/16/2014 0832     Intake/Output Summary (Last 24 hours) at 07/17/14 0716 Last data filed at 07/17/14 0600  Gross per 24 hour  Intake   5820 ml  Output   1375 ml  Net   4445 ml     Assessment:  64 y.o. female is s/p:  1. Right common femoral artery cannulation with ultrasound guidance 2. Second order arterial selection 3. Left leg angiogram 4. Angioplasty and stenting Left superficial femoral artery (Viabahn 6 mm x 50 mm) 5. Washout of pre-patellar laceration 6. Simple repair of pre-patellar laceration 7. Incision and drainage of left thigh hematoma Placement of negative pressure dressing in  left thigh hematoma cavity  1 Day Post-Op  Plan:  -pt with palpable left DP -acute blood loss anemia with hypotension-receiving 2nd unit of PRBC's -check labs in am -wound vac with good seal-removal today vs tomorrow? Will d/w Dr. Imogene Burnhen. 125cc output recorded for vac drainage. -discontinue foley -mobilize and out of bed-pt has net been out of bed to chair -DVT prophylaxis:  Lovenox this am   Doreatha MassedSamantha Rhyne, PA-C [facility] [phone] 07/17/2014 7:16 AM  Addendum  I have independently interviewed and examined the patient, and I agree with the physician assistant's findings.  Pt transfused 1 u pRBC last night with some response to BP after such a 2 L NS.  Now BP soft again.  2nd unit of pRBC transfused.  Cardiac enzyme negative so far.  Will cycle another 2 troponins.  At this point, complete asx hypotension with intact UOP.  Unclear etiology of hypotension.  ?SIRS.  May need Dopamine drip is unresponsive to measures.  L distal medial skin flap appears less ischemic than previously though some progression in blistering.  L foot is viable.  VAC change tomorrow.  [facility].   Leonides SakeBrian Percival Glasheen, MD [facility] [place] Office: [phone] Pager: [phone]  07/17/2014, 9:28 AM

## 2014-07-17 NOTE — Evaluation (Signed)
Occupational Therapy Evaluation Patient Details Name: Pamela LovettRebecca Perez MRN: [id number] DOB: 06/21/1950 Today's Date: 07/17/2014    History of Present Illness Patient is a 64 y/o female s/p fall from golf cart with L distal thigh hematoma. Pt s/p Left Leg Angiogram, Stenting of Left Superficial Femoral Artery, Repair of Pre-Patella Laceration, Evacuation of Left Thigh Hematoma. PMH includes HTN.    Clinical Impression   Pt admitted with above. She demonstrates the below listed deficits and will benefit from continued OT to maximize safety and independence with BADLs.  Pt is currently limited by pain.  She requires max A for LB ADLs and min A for functional transfers.  Anticipate good progress.  She plans to return home with assist from family.       Follow Up Recommendations  No OT follow up;Supervision/Assistance - 24 hour    Equipment Recommendations  None recommended by OT    Recommendations for Other Services       Precautions / Restrictions Precautions Precautions: Fall Precaution Comments: No knee AROM restrictions noted. Restrictions Weight Bearing Restrictions: No      Mobility Bed Mobility Overal bed mobility: Needs Assistance Bed Mobility: Sit to Supine     Supine to sit: HOB elevated;Min assist Sit to supine: Min assist   General bed mobility comments: Min A to lift Lt LE onto bed   Transfers Overall transfer level: Needs assistance Equipment used: 1 person hand held assist Transfers: Sit to/from [employer]Stand;Stand Pivot Transfers Sit to Stand: Min assist Stand pivot transfers: Min assist       General transfer comment: Min A for balance.  Pt unable to fully weight bear through Lt LE     Balance Overall balance assessment: Needs assistance Sitting-balance support: Feet supported Sitting balance-Leahy Scale: Fair     Standing balance support: Single extremity supported Standing balance-Leahy Scale: Poor Standing balance comment: Relient on external  support for balance.                             ADL Overall ADL's : Needs assistance/impaired Eating/Feeding: Independent   Grooming: Wash/dry hands;Wash/dry face;Oral care;Brushing hair;Set up;Sitting   Upper Body Bathing: Set up;Sitting   Lower Body Bathing: Maximal assistance;Sit to/from stand   Upper Body Dressing : Set up;Sitting   Lower Body Dressing: Total assistance;Sit to/from stand   Toilet Transfer: Stand-pivot;BSC;Minimal assistance   Toileting- Clothing Manipulation and Hygiene: Maximal assistance;Sit to/from stand       Functional mobility during ADLs: Minimal assistance (stand pivot transfer only ) General ADL Comments: Pt unable to flex knee to access feet due to pain and functional transfers are limited by pain      Vision     Perception     Praxis      Pertinent Vitals/Pain Pain Assessment: 0-10 Pain Score: 3  Pain Location: Lt LE  Pain Descriptors / Indicators: Aching Pain Intervention(s): Monitored during session     Hand Dominance Right   Extremity/Trunk Assessment Upper Extremity Assessment Upper Extremity Assessment: Overall WFL for tasks assessed   Lower Extremity Assessment Lower Extremity Assessment: Defer to PT evaluation LLE Deficits / Details: Limited AROM knee flexion/hip flexion secondary to pain. Ankle AROM WFL. Swelling present throughout LLE, warmth. LLE: Unable to fully assess due to pain LLE Sensation: decreased light touch   Cervical / Trunk Assessment Cervical / Trunk Assessment: Normal   Communication Communication Communication: No difficulties   Cognition Arousal/Alertness: Awake/alert Behavior During Therapy: [facility] for  tasks assessed/performed Overall Cognitive Status: Within Functional Limits for tasks assessed                     General Comments       Exercises       Shoulder Instructions      Home Living Family/patient expects to be discharged to:: Private residence Living  Arrangements: Spouse/significant other Available Help at Discharge: Family;Available 24 hours/day Type of Home: House Home Access: Stairs to enter [employer] of Steps: 1 Entrance Stairs-Rails: None       Bathroom Shower/Tub: Walk-in shower;Tub/shower unit Shower/tub characteristics: Engineer, building services: Standard     Home Equipment: None   Additional Comments: Pt has multiple family members that will be available to assist at discharge       Prior Functioning/Environment Level of Independence: Independent        Comments: 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     OT Diagnosis: Generalized weakness;Acute pain   OT Problem List: Decreased strength;Decreased activity tolerance;Impaired balance (sitting and/or standing);Decreased knowledge of use of DME or AE;Pain   OT Treatment/Interventions: Self-care/ADL training;DME and/or AE instruction;Therapeutic activities;Patient/family education;Balance training    OT Goals(Current goals can be found in the care plan section) Acute Rehab OT Goals Patient Stated Goal: to go home OT Goal Formulation: With patient Time For Goal Achievement: 07/24/14 Potential to Achieve Goals: Good ADL Goals Pt Will Perform Grooming: with modified independence;standing Pt Will Perform Lower Body Bathing: with modified independence;sit to/from stand;with adaptive equipment Pt Will Perform Lower Body Dressing: with modified independence;with adaptive equipment;sit to/from stand Pt Will Transfer to Toilet: with modified independence;ambulating;regular height toilet;bedside commode;grab bars Pt Will Perform Toileting - Clothing Manipulation and hygiene: with modified independence;sit to/from stand Pt Will Perform Tub/Shower Transfer: Shower transfer;with min guard assist;ambulating;shower seat;rolling walker  OT Frequency: Min 2X/week   Barriers to D/C:            Co-evaluation              End of Session  Nurse Communication: Mobility status  Activity Tolerance: No increased pain Patient left: in bed;with call bell/phone within reach;with nursing/sitter in room   Time: 1610-9604 OT Time Calculation (min): 18 min Charges:  OT General Charges $OT Visit: 1 Procedure OT Evaluation $Initial OT Evaluation Tier I: 1 Procedure G-Codes:    Jeani Hawking M 08-16-2014, 3:34 PM

## 2014-07-17 NOTE — Progress Notes (Signed)
Third attempt to page Dr. Imogene Burnhen successful, notified of low blood pressure despite NS 500cc bolus x 2. New orders received. Will continue to monitor.

## 2014-07-17 NOTE — Evaluation (Signed)
Physical Therapy Evaluation Patient Details Name: Delfino LovettRebecca Sitts MRN: [id number] DOB: 02/28/1950 Today's Date: 07/17/2014   History of Present Illness  Patient is a 64 y/o female s/p fall from golf cart with L distal thigh hematoma. Pt s/p Left Leg Angiogram, Stenting of Left Superficial Femoral Artery, Repair of Pre-Patella Laceration, Evacuation of Left Thigh Hematoma. PMH includes HTN.   Clinical Impression  Patient presents with pain in LLE s/p above surgery impacting balance and mobility. Pt requires increased time for all mobility due to pain. Tolerated SPT to chair with Mod A for balance/safety. Would benefit from use of RW to assist with transfers/gait. Pt reports having 24/7 S at home. May need a w/c pending progress with mobility while in hospital.   Supine BP 119/55 + dizziness however resolved Sitting BP  159/73  + dizziness however resolved quickly Sitting BP post transfer 124/53, asymptomatic  Pt would benefit from skilled PT to maximize independence and mobility prior to return home.    Follow Up Recommendations Home health PT;Supervision/Assistance - 24 hour    Equipment Recommendations  Rolling walker with 5" wheels    Recommendations for Other Services OT consult     Precautions / Restrictions Precautions Precautions: Fall Precaution Comments: No knee AROM restrictions noted. Restrictions Weight Bearing Restrictions: No      Mobility  Bed Mobility Overal bed mobility: Needs Assistance Bed Mobility: Supine to Sit     Supine to sit: HOB elevated;Min assist     General bed mobility comments: Min A to bring LLE to EOB and scoot bottom. Use of rails for support. increased time. + dizziness.   Transfers Overall transfer level: Needs assistance Equipment used: 1 person hand held assist Transfers: Sit to/from [employer]Stand;Stand Pivot Transfers Sit to Stand: Min assist Stand pivot transfers: Mod assist       General transfer comment: Min A to rise from EOB. Pt  not placing full weight through LLE. Increased WB through UE. Mod A SPT bed to chair with cues for technique and balance. Increased pain/time.  Ambulation/Gait                Stairs            Wheelchair Mobility    Modified Rankin (Stroke Patients Only)       Balance Overall balance assessment: Needs assistance Sitting-balance support: Feet supported;Bilateral upper extremity supported Sitting balance-Leahy Scale: Fair     Standing balance support: During functional activity Standing balance-Leahy Scale: Poor Standing balance comment: Relient on external support for balance.                              Pertinent Vitals/Pain Pain Assessment: 0-10 Pain Score: 10-Worst pain ever Pain Location: LLE post transfer Pain Descriptors / Indicators: Sore;Aching Pain Intervention(s): Limited activity within patient's tolerance;Monitored during session;Repositioned;Patient requesting pain meds-RN notified    Home Living Family/patient expects to be discharged to:: Private residence Living Arrangements: Spouse/significant other Available Help at Discharge: Family;Available 24 hours/day Type of Home: House Home Access: Stairs to enter Entrance Stairs-Rails: None Entrance Stairs-Number of Steps: 1   Home Equipment: None      Prior Function Level of Independence: Independent         Comments: Pt has multiple family members to assist at home - sisters, son, spouse.     Hand Dominance        Extremity/Trunk Assessment   Upper Extremity Assessment: Defer to OT evaluation  Lower Extremity Assessment: LLE deficits/detail   LLE Deficits / Details: Limited AROM knee flexion/hip flexion secondary to pain. Ankle AROM WFL. Swelling present throughout LLE, warmth.     Communication   Communication: No difficulties  Cognition Arousal/Alertness: Awake/alert Behavior During Therapy: WFL for tasks assessed/performed Overall Cognitive  Status: Within Functional Limits for tasks assessed                      General Comments      Exercises General Exercises - Lower Extremity Ankle Circles/Pumps: Both;10 reps;Supine Gluteal Sets: Both;10 reps;Seated      Assessment/Plan    PT Assessment Patient needs continued PT services  PT Diagnosis Difficulty walking;Acute pain   PT Problem List Decreased strength;Pain;Decreased range of motion;Impaired sensation;Decreased activity tolerance;Decreased balance;Decreased mobility;Decreased knowledge of use of DME  PT Treatment Interventions Balance training;Gait training;Functional mobility training;Therapeutic activities;Therapeutic exercise;Stair training;DME instruction;Patient/family education   PT Goals (Current goals can be found in the Care Plan section) Acute Rehab PT Goals Patient Stated Goal: to go home PT Goal Formulation: With patient Time For Goal Achievement: 07/31/14 Potential to Achieve Goals: Fair    Frequency Min 4X/week   Barriers to discharge        Co-evaluation               End of Session Equipment Utilized During Treatment: Gait belt Activity Tolerance: Patient limited by pain Patient left: in chair;with call bell/phone within reach;with family/visitor present;with nursing/sitter in room           Time: 1211-1258 PT Time Calculation (min) (ACUTE ONLY): 47 min   Charges:   PT Evaluation $Initial PT Evaluation Tier I: 1 Procedure PT Treatments $Therapeutic Activity: 23-37 mins   PT G Codes:        Alphons Burgert A Cenia Zaragosa 07/17/2014, 1:45 PM Mylo Red, PT, DPT [phone]

## 2014-07-18 ENCOUNTER — Encounter (HOSPITAL_COMMUNITY): Payer: Self-pay | Admitting: Vascular Surgery

## 2014-07-18 LAB — TYPE AND SCREEN
ABO/RH(D): AB POS
Antibody Screen: NEGATIVE
Unit division: 0
Unit division: 0

## 2014-07-18 LAB — CBC
HCT: 31.1 % — ABNORMAL LOW (ref 36.0–46.0)
Hemoglobin: 10.3 g/dL — ABNORMAL LOW (ref 12.0–15.0)
MCH: 29.4 pg (ref 26.0–34.0)
MCHC: 33.1 g/dL (ref 30.0–36.0)
MCV: 88.9 fL (ref 78.0–100.0)
Platelets: 169 10*3/uL (ref 150–400)
RBC: 3.5 MIL/uL — AB (ref 3.87–5.11)
RDW: 14.5 % (ref 11.5–15.5)
WBC: 9.4 10*3/uL (ref 4.0–10.5)

## 2014-07-18 LAB — BASIC METABOLIC PANEL
ANION GAP: 7 (ref 5–15)
BUN: 10 mg/dL (ref 6–20)
CO2: 23 mmol/L (ref 22–32)
Calcium: 8.2 mg/dL — ABNORMAL LOW (ref 8.9–10.3)
Chloride: 110 mmol/L (ref 101–111)
Creatinine, Ser: 0.71 mg/dL (ref 0.44–1.00)
GFR calc non Af Amer: >60 mL/min (ref >60)
Glucose, Bld: 110 mg/dL — ABNORMAL HIGH (ref 65–99)
Potassium: 3.5 mmol/L (ref 3.5–5.1)
Sodium: 140 mmol/L (ref 135–145)

## 2014-07-18 MED ORDER — METOPROLOL TARTRATE 25 MG PO TABS
25.0000 mg | ORAL_TABLET | Freq: Two times a day (BID) | ORAL | Status: DC
  Administered 2014-07-18 – 2014-07-21 (×5): 25 mg via ORAL
  Filled 2014-07-18 (×6): qty 1
  Filled 2014-07-18: qty 2
  Filled 2014-07-18: qty 1

## 2014-07-18 MED ORDER — MORPHINE SULFATE 2 MG/ML IJ SOLN
2.0000 mg | INTRAMUSCULAR | Status: DC | PRN
  Administered 2014-07-20 – 2014-07-21 (×3): 2 mg via INTRAVENOUS
  Filled 2014-07-18 (×3): qty 1

## 2014-07-18 NOTE — Progress Notes (Signed)
UR COMPLETED  

## 2014-07-18 NOTE — Progress Notes (Addendum)
  Progress Note    07/18/2014 9:40 AM 2 Days Post-Op  Subjective:  Sleeping soundly-husband states Dr. Imogene Burnhen has been in.  She is still having some pain; walking around a little.  Afebrile HR 80's-90's  110's-150's systolic 98% RA  Filed Vitals:   07/18/14 0839  BP: 156/63  Pulse: 91  Temp: 98.7 F (37.1 C)  Resp: 17     CBC    Component Value Date/Time   WBC 9.4 07/18/2014 0226   RBC 3.50* 07/18/2014 0226   HGB 10.3* 07/18/2014 0226   HCT 31.1* 07/18/2014 0226   PLT 169 07/18/2014 0226   MCV 88.9 07/18/2014 0226   MCH 29.4 07/18/2014 0226   MCHC 33.1 07/18/2014 0226   RDW 14.5 07/18/2014 0226   LYMPHSABS 1.3 07/16/2014 0539   MONOABS 1.0 07/16/2014 0539   EOSABS 0.0 07/16/2014 0539   BASOSABS 0.0 07/16/2014 0539    BMET    Component Value Date/Time   NA 140 07/18/2014 0226   K 3.5 07/18/2014 0226   CL 110 07/18/2014 0226   CO2 23 07/18/2014 0226   GLUCOSE 110* 07/18/2014 0226   BUN 10 07/18/2014 0226   CREATININE 0.71 07/18/2014 0226   CALCIUM 8.2* 07/18/2014 0226   GFRNONAA >60 07/18/2014 0226   GFRAA >60 07/18/2014 0226    INR    Component Value Date/Time   INR 1.13 07/16/2014 0832     Intake/Output Summary (Last 24 hours) at 07/18/14 0940 Last data filed at 07/18/14 0800  Gross per 24 hour  Intake 1081.25 ml  Output    800 ml  Net 281.25 ml     Assessment:  64 y.o. female is s/p:  1. Right common femoral artery cannulation with ultrasound guidance 2. Second order arterial selection 3. Left leg angiogram 4. Angioplasty and stenting Left superficial femoral artery (Viabahn 6 mm x 50 mm) 5. Washout of pre-patellar laceration 6. Simple repair of pre-patellar laceration 7. Incision and drainage of left thigh hematoma Placement of negative pressure dressing in left thigh hematoma cavity   2 Days Post-Op  Plan: -pt sleeping upon arrival.  Did not wake pt up as Dr. Imogene Burnhen saw her this morning. -continue wound vac -transfer to 2  west -most likely to OR Thursday for washout of wound and reapplication of vac -increase mobilization -DVT prophylaxis:  Lovenox -wound vac with 75cc/24hr yesterday   Doreatha MassedSamantha Rhyne, PA-C [facility] [phone] 07/18/2014 9:40 AM  Addendum  I have independently interviewed and examined the patient, and I agree with the physician assistant's findings.  ABI demonstrate continued patency of R SFA with minimal tibial disease.  L thigh posterior incision flaps looks viable though some skin appears to sloughing as expected.  Will plan on washout on Thursday and debridement of any residual dead skin.  The plan is to get the patient out of the hospital by Friday with wound care at home.  Leonides SakeBrian Chen, MD [facility] [place] Office: [phone] Pager: [phone]  07/18/2014, 3:27 PM

## 2014-07-18 NOTE — Progress Notes (Signed)
Physical Therapy Treatment Patient Details Name: Pamela LovettRebecca Perez MRN: [id number] DOB: 11/18/1950 Today's Date: 07/18/2014    History of Present Illness Patient is a 64 y/o female s/p fall from golf cart with L distal thigh hematoma. Pt s/p Left Leg Angiogram, Stenting of Left Superficial Femoral Artery, Repair of Pre-Patella Laceration, Evacuation of Left Thigh Hematoma. PMH includes HTN.     PT Comments    Progressing well with all except L knee flexion.  Follow Up Recommendations  Home health PT;Supervision/Assistance - 24 hour     Equipment Recommendations  Rolling walker with 5" wheels    Recommendations for Other Services       Precautions / Restrictions Precautions Precautions: Fall    Mobility  Bed Mobility Overal bed mobility: Needs Assistance Bed Mobility: Sit to Supine     Supine to sit: Min assist     General bed mobility comments: Min A to lift Lt LE onto bed   Transfers Overall transfer level: Needs assistance Equipment used: Rolling walker (2 wheeled) Transfers: Sit to/from Stand Sit to Stand:  (from lower surface)         General transfer comment: light min assist from lower elevation.  Ambulation/Gait Ambulation/Gait assistance: Min guard Ambulation Distance (Feet): 15 Feet (then 100 feet with RW) Assistive device: Rolling walker (2 wheeled) Gait Pattern/deviations: Step-to pattern Gait velocity: slower   General Gait Details: step to pattern, sequencing well for decreasing knee pain.   Stairs            Wheelchair Mobility    Modified Rankin (Stroke Patients Only)       Balance Overall balance assessment: Needs assistance Sitting-balance support: No upper extremity supported Sitting balance-Leahy Scale: Good       Standing balance-Leahy Scale: Fair Standing balance comment: based on pericare at the toilet                    Cognition Arousal/Alertness: Awake/alert Behavior During Therapy: WFL for tasks  assessed/performed Overall Cognitive Status: Within Functional Limits for tasks assessed                      Exercises General Exercises - Lower Extremity Ankle Circles/Pumps: Both;10 reps;Supine Gluteal Sets: Both;10 reps;Seated    General Comments        Pertinent Vitals/Pain Pain Assessment: 0-10 Pain Score: 4  Pain Location: L LE at knee mostly Pain Descriptors / Indicators: Aching;Sore Pain Intervention(s): Monitored during session;Repositioned    Home Living                      Prior Function            PT Goals (current goals can now be found in the care plan section) Acute Rehab PT Goals Patient Stated Goal: to go home PT Goal Formulation: With patient Time For Goal Achievement: 07/31/14 Potential to Achieve Goals: Fair Progress towards PT goals: Progressing toward goals    Frequency  Min 4X/week    PT Plan Current plan remains appropriate    Co-evaluation             End of Session   Activity Tolerance: Patient tolerated treatment well Patient left: in chair;with call bell/phone within reach;with family/visitor present     Time: [id number] PT Time Calculation (min) (ACUTE ONLY): 33 min  Charges:  $Gait Training: 8-22 mins $Therapeutic Activity: 8-22 mins  G Codes:      Young Brim, Eliseo Gum 07/18/2014, 1:58 PM  07/18/2014  [place] Bing, PT [phone]  (pager)

## 2014-07-19 MED ORDER — CEFUROXIME SODIUM 1.5 G IJ SOLR: 1.5000 g | INTRAMUSCULAR | Status: AC

## 2014-07-19 NOTE — Progress Notes (Signed)
Pt slept soundly all night without c/o assisted to bathroom x 1 denied pain  Wound vac to left thigh wound intact draining serosanguineous drain in small amout . LLE edema + 2 elevated on pillow  .Positive pedal and popliteal pulses.

## 2014-07-19 NOTE — Progress Notes (Signed)
Occupational Therapy Treatment Patient Details Name: Pamela LovettRebecca Perez MRN: [id number] DOB: 02/20/1950 Today's Date: 07/19/2014    History of present illness Patient is a 64 y.o. female s/p fall from golf cart with L distal thigh hematoma. Pt s/p Left Leg Angiogram, Stenting of Left Superficial Femoral Artery, Repair of Pre-Patella Laceration, Evacuation of Left Thigh Hematoma. To return to OR 6/2 for I&D of pre-patellar wound. PMH includes HTN.    OT comments  Pt progressing toward goals. Plan to practice shower transfer next session. Pt plans to have spouse assist with LB ADLs at home, so not interested in AE at this time.  Follow Up Recommendations  No OT follow up;Supervision/Assistance - 24 hour    Equipment Recommendations  None recommended by OT    Recommendations for Other Services      Precautions / Restrictions Precautions Precautions: Fall Restrictions Weight Bearing Restrictions: No       Mobility Bed Mobility     General bed mobility comments: not assessed  Transfers Overall transfer level: Needs assistance  Transfers: Sit to/from Stand Sit to Stand: Min guard;Supervision            Balance  Supervision for ambulation. Supervision at sink-unsteady.                               ADL Overall ADL's : Needs assistance/impaired     Grooming: Brushing hair;Wash/dry face;Wash/dry hands;Set up;Supervision/safety;Standing             Upper Body Dressing Details (indicate cue type and reason): adjusted gown standing-supervision     Toilet Transfer: Supervision/safety;RW;Regular Toilet;Grab bars   Toileting- Clothing Manipulation and Hygiene: Supervision/safety;Sit to/from stand       Functional mobility during ADLs: Supervision/safety;Rolling walker General ADL Comments: Pt unable to fully reach left sock to doff it. OT educated on AE/cost as well as LB dressing technique. Pt plans to have spouse assist with LB ADLs at home. Educated on  safety such as use of bag on walker, safe footwear, sitting for LB ADLs, rugs/items on floor, and recommended spouse be with her for shower transfer. Educated/demonstrated walk-in shower transfer with RW. Pt reports that family member has DME she can use. Talked about having 3 in 1 over commode as pt using grab bar for toilet transfer.      Vision                     Perception     Praxis      Cognition  Awake/Alert Behavior During Therapy: WFL for tasks assessed/performed Overall Cognitive Status: Within Functional Limits for tasks assessed                       Extremity/Trunk Assessment                  Shoulder Instructions       General Comments      Pertinent Vitals/ Pain       Pain Assessment: 0-10 Pain Score: 3  Pain Location: LLE Pain Descriptors / Indicators: Aching;Burning Pain Intervention(s): Repositioned;Monitored during session;Limited activity within patient's tolerance  Home Living                                          Prior Functioning/Environment  Frequency Min 2X/week     Progress Toward Goals  OT Goals(current goals can now be found in the care plan section)  Progress towards OT goals: Progressing toward goals  Acute Rehab OT Goals Patient Stated Goal: not stated OT Goal Formulation: With patient Time For Goal Achievement: 07/24/14 Potential to Achieve Goals: Good ADL Goals Pt Will Perform Grooming: with modified independence;standing Pt Will Perform Lower Body Bathing: with modified independence;sit to/from stand;with adaptive equipment Pt Will Perform Lower Body Dressing: with modified independence;with adaptive equipment;sit to/from stand Pt Will Transfer to Toilet: with modified independence;ambulating;regular height toilet;bedside commode;grab bars Pt Will Perform Toileting - Clothing Manipulation and hygiene: with modified independence;sit to/from stand Pt Will Perform  Tub/Shower Transfer: Shower transfer;with min guard assist;ambulating;shower seat;rolling walker  Plan Discharge plan remains appropriate    Co-evaluation                 End of Session Equipment Utilized During Treatment: Gait belt;Rolling walker   Activity Tolerance Patient limited by pain   Patient Left in chair;with family/visitor present;with nursing/sitter in room   Nurse Communication Other (comment) (pain)        Time: 8119-1478 OT Time Calculation (min): 16 min  Charges: OT General Charges $OT Visit: 1 Procedure OT Treatments $Self Care/Home Management : 8-22 mins  Earlie Raveling OTR/L [phone] 07/19/2014, 12:13 PM

## 2014-07-19 NOTE — Progress Notes (Signed)
Physical Therapy Treatment Patient Details Name: Pamela LovettRebecca Perez MRN: [id number] DOB: 09/13/1950 Today's Date: 07/19/2014    History of Present Illness Patient is a 64 y/o female s/p fall from golf cart with L distal thigh hematoma. Pt s/p Left Leg Angiogram, Stenting of Left Superficial Femoral Artery, Repair of Pre-Patella Laceration, Evacuation of Left Thigh Hematoma. To return to OR 6/2 for I&D of pre-patellar wound. PMH includes HTN.     PT Comments    Pt progressing very well. Continues to be limited by knee pain and decr knee ROM (especially during gait). Very motivated and performing exercises as she had been instructed. Noted plans for OR 6/2. Will continue to follow.    Follow Up Recommendations  Home health PT;Supervision - Intermittent     Equipment Recommendations  Rolling walker with 5" wheels    Recommendations for Other Services       Precautions / Restrictions Precautions Precautions: Fall    Mobility  Bed Mobility Overal bed mobility: Needs Assistance Bed Mobility: Supine to Sit     Supine to sit: Min assist     General bed mobility comments: Min A to lift torso  Transfers Overall transfer level: Needs assistance Equipment used: Rolling walker (2 wheeled) Transfers: Sit to/from Stand Sit to Stand: Supervision (from lower surface)         General transfer comment: pt demonstrated safe use of RW; supervision for safety  Ambulation/Gait Ambulation/Gait assistance: Min guard;Supervision Ambulation Distance (Feet): 200 Feet Assistive device: Rolling walker (2 wheeled)   Gait velocity: slower   General Gait Details: step to pattern with pt naturally progressing to step through; pt tends to keep LLE stiff and move as one unit with hip hike; educated on knee flexion followed by heelstrike with pt having to decr velocity significantly, however with improving knee flexion   Stairs            Wheelchair Mobility    Modified Rankin (Stroke  Patients Only)       Balance             Standing balance-Leahy Scale: Fair                      Cognition Arousal/Alertness: Awake/alert Behavior During Therapy: WFL for tasks assessed/performed Overall Cognitive Status: Within Functional Limits for tasks assessed                      Exercises General Exercises - Lower Extremity Ankle Circles/Pumps: Both;10 reps;Seated Heel Slides: AROM;Left;5 reps;Seated (slide heel under knee (knee flexion) hold 10-30 seconds)    General Comments        Pertinent Vitals/Pain Pain Assessment: 0-10 Pain Score: 5  Pain Location: Lt knee Pain Intervention(s): Limited activity within patient's tolerance;Monitored during session;Repositioned    Home Living                      Prior Function            PT Goals (current goals can now be found in the care plan section) Acute Rehab PT Goals Patient Stated Goal: to go home Time For Goal Achievement: 07/31/14 Potential to Achieve Goals: Good Progress towards PT goals: Progressing toward goals    Frequency  Min 4X/week    PT Plan Current plan remains appropriate    Co-evaluation             End of Session   Activity Tolerance: Patient tolerated treatment  well Patient left: in chair;with call bell/phone within reach;with family/visitor present     Time: 1610-9604 PT Time Calculation (min) (ACUTE ONLY): 31 min  Charges:  $Gait Training: 8-22 mins $Therapeutic Exercise: 8-22 mins                    G Codes:      Emmitte Surgeon 2014-08-18, 12:05 PM Pager [phone]

## 2014-07-19 NOTE — Care Management Note (Signed)
Case Management Note  Patient Details  Name: Pamela Perez MRN: [id number] Date of Birth: 08/28/1950  Subjective/Objective:         Angioplasty and stenting Left superficial femoral artery           Action/Plan: Lives at home with husband  Expected Discharge Date:    07/21/2014              Expected Discharge Plan:  Home w Home Health Services  In-House Referral:     Discharge planning Services  CM Consult  Post Acute Care Choice:  Home Health Choice offered to:  Patient  DME Arranged:    DME Agency:     HH Arranged:    HH Agency:  Other - See comment, Loralee PacasPennybryn at [facility]  Status of Service:  Completed, signed off  Medicare Important Message Given:  No Date Medicare IM Given:    Medicare IM give by:    Date Additional Medicare IM Given:    Additional Medicare Important Message give by:     If discussed at Long Length of Stay Meetings, dates discussed:    Additional Comments: NCM spoke to pt and gave permission to speak to husband. Husband states pt's sister is a Charity fundraiserN and will be able to assist with dressing changes at home. Explained wound vac maybe needed at dc. Left form for attending to sign on pt's chart. Contacted [facility] for referral for wound vac. Waiting final recommendation for home.   Elliot CousinShavis, Aaron Bostwick Ellen, RN 07/19/2014, 2:56 PM

## 2014-07-19 NOTE — Progress Notes (Addendum)
  Progress Note    07/19/2014 8:04 AM 3 Days Post-Op  Subjective:  Sleeping soundly again this am  Afebrile HR 60's-70's 140's-160's systolic 98% RA  Filed Vitals:   07/19/14 0341  BP: 144/61  Pulse: 73  Temp: 98.7 F (37.1 C)  Resp: 18    Physical Exam: Not done as Dr. Imogene Burnhen has already been in to see the pt.  CBC    Component Value Date/Time   WBC 9.4 07/18/2014 0226   RBC 3.50* 07/18/2014 0226   HGB 10.3* 07/18/2014 0226   HCT 31.1* 07/18/2014 0226   PLT 169 07/18/2014 0226   MCV 88.9 07/18/2014 0226   MCH 29.4 07/18/2014 0226   MCHC 33.1 07/18/2014 0226   RDW 14.5 07/18/2014 0226   LYMPHSABS 1.3 07/16/2014 0539   MONOABS 1.0 07/16/2014 0539   EOSABS 0.0 07/16/2014 0539   BASOSABS 0.0 07/16/2014 0539    BMET    Component Value Date/Time   NA 140 07/18/2014 0226   K 3.5 07/18/2014 0226   CL 110 07/18/2014 0226   CO2 23 07/18/2014 0226   GLUCOSE 110* 07/18/2014 0226   BUN 10 07/18/2014 0226   CREATININE 0.71 07/18/2014 0226   CALCIUM 8.2* 07/18/2014 0226   GFRNONAA >60 07/18/2014 0226   GFRAA >60 07/18/2014 0226    INR    Component Value Date/Time   INR 1.13 07/16/2014 0832     Intake/Output Summary (Last 24 hours) at 07/19/14 0804 Last data filed at 07/19/14 0600  Gross per 24 hour  Intake    480 ml  Output    765 ml  Net   -285 ml     Assessment:  64 y.o. female is s/p:  1. Right common femoral artery cannulation with ultrasound guidance 2. Second order arterial selection 3. Left leg angiogram 4. Angioplasty and stenting Left superficial femoral artery (Viabahn 6 mm x 50 mm) 5. Washout of pre-patellar laceration 6. Simple repair of pre-patellar laceration 7. Incision and drainage of left thigh hematoma Placement of negative pressure dressing in left thigh hematoma cavity  3 Days Post-Op  Plan: -plan for pt to go to the operating room tomorrow for washout and debridement of any residual dead skin. -DVT prophylaxis:   Lovenox -hopefully home Friday with a wound vac. Case management consult ordered and face to face filled out. -pt pre-op orders placed   Doreatha MassedSamantha Rhyne, PA-C [facility] [phone] 07/19/2014 8:04 AM   Addendum  I have independently interviewed and examined the patient, and I agree with the physician assistant's findings.    Leonides SakeBrian Lessie Manigo, MD [facility] [place] Office: [phone] Pager: [phone]  07/19/2014, 8:32 AM

## 2014-07-20 ENCOUNTER — Inpatient Hospital Stay (HOSPITAL_COMMUNITY): Payer: BLUE CROSS/BLUE SHIELD | Admitting: Anesthesiology

## 2014-07-20 ENCOUNTER — Encounter (HOSPITAL_COMMUNITY): Payer: Self-pay | Admitting: Anesthesiology

## 2014-07-20 ENCOUNTER — Encounter (HOSPITAL_COMMUNITY)
Admission: EM | Disposition: A | Discharge status: Home Health | Payer: Self-pay | Source: Home / Self Care | Attending: Vascular Surgery

## 2014-07-20 DIAGNOSIS — M7981 Nontraumatic hematoma of soft tissue: Secondary | ICD-10-CM

## 2014-07-20 HISTORY — PX: APPLICATION OF WOUND VAC: SHX5189

## 2014-07-20 HISTORY — PX: I & D EXTREMITY: SHX5045

## 2014-07-20 LAB — BASIC METABOLIC PANEL
ANION GAP: 12 (ref 5–15)
BUN: 7 mg/dL (ref 6–20)
CO2: 26 mmol/L (ref 22–32)
Calcium: 8.7 mg/dL — ABNORMAL LOW (ref 8.9–10.3)
Chloride: 101 mmol/L (ref 101–111)
Creatinine, Ser: 0.77 mg/dL (ref 0.44–1.00)
GFR calc Af Amer: >60 mL/min (ref >60)
GFR calc non Af Amer: >60 mL/min (ref >60)
Glucose, Bld: 92 mg/dL (ref 65–99)
Potassium: 3.5 mmol/L (ref 3.5–5.1)
Sodium: 139 mmol/L (ref 135–145)

## 2014-07-20 LAB — CBC
HCT: 32.1 % — ABNORMAL LOW (ref 36.0–46.0)
Hemoglobin: 10.9 g/dL — ABNORMAL LOW (ref 12.0–15.0)
MCH: 29.8 pg (ref 26.0–34.0)
MCHC: 34 g/dL (ref 30.0–36.0)
MCV: 87.7 fL (ref 78.0–100.0)
Platelets: 226 10*3/uL (ref 150–400)
RBC: 3.66 MIL/uL — AB (ref 3.87–5.11)
RDW: 13.4 % (ref 11.5–15.5)
WBC: 8 10*3/uL (ref 4.0–10.5)

## 2014-07-20 SURGERY — IRRIGATION AND DEBRIDEMENT EXTREMITY
Anesthesia: General | Site: Thigh | Laterality: Left

## 2014-07-20 MED ORDER — ONDANSETRON HCL 4 MG/2ML IJ SOLN: 4.0000 mg | Freq: Once | INTRAMUSCULAR | Status: DC | PRN

## 2014-07-20 MED ORDER — MIDAZOLAM HCL 2 MG/2ML IJ SOLN
INTRAMUSCULAR | Status: AC
  Filled 2014-07-20: qty 2

## 2014-07-20 MED ORDER — HYDROMORPHONE HCL 1 MG/ML IJ SOLN: 0.5000 mg | INTRAMUSCULAR | Status: DC | PRN

## 2014-07-20 MED ORDER — PROPOFOL 10 MG/ML IV BOLUS
INTRAVENOUS | Status: DC | PRN
  Administered 2014-07-20: 200 mg via INTRAVENOUS

## 2014-07-20 MED ORDER — DEXAMETHASONE SODIUM PHOSPHATE 4 MG/ML IJ SOLN
INTRAMUSCULAR | Status: DC | PRN
  Administered 2014-07-20: 4 mg via INTRAVENOUS

## 2014-07-20 MED ORDER — FENTANYL CITRATE (PF) 250 MCG/5ML IJ SOLN
INTRAMUSCULAR | Status: AC
  Filled 2014-07-20: qty 5

## 2014-07-20 MED ORDER — SODIUM CHLORIDE 0.9 % IV SOLN
INTRAVENOUS | Status: DC | PRN
  Administered 2014-07-20: 15:00:00 via INTRAVENOUS

## 2014-07-20 MED ORDER — ONDANSETRON HCL 4 MG/2ML IJ SOLN
4.0000 mg | Freq: Once | INTRAMUSCULAR | Status: AC
  Administered 2014-07-20: 4 mg via INTRAVENOUS

## 2014-07-20 MED ORDER — LIDOCAINE HCL (CARDIAC) 20 MG/ML IV SOLN
INTRAVENOUS | Status: DC | PRN
  Administered 2014-07-20: 100 mg via INTRAVENOUS

## 2014-07-20 MED ORDER — MIDAZOLAM HCL 5 MG/5ML IJ SOLN
INTRAMUSCULAR | Status: DC | PRN
  Administered 2014-07-20: 2 mg via INTRAVENOUS

## 2014-07-20 MED ORDER — ONDANSETRON HCL 4 MG/2ML IJ SOLN
INTRAMUSCULAR | Status: AC
  Filled 2014-07-20: qty 2

## 2014-07-20 MED ORDER — DEXAMETHASONE SODIUM PHOSPHATE 4 MG/ML IJ SOLN
INTRAMUSCULAR | Status: AC
  Filled 2014-07-20: qty 1

## 2014-07-20 MED ORDER — FENTANYL CITRATE (PF) 100 MCG/2ML IJ SOLN
INTRAMUSCULAR | Status: DC | PRN
  Administered 2014-07-20: 50 ug via INTRAVENOUS

## 2014-07-20 MED ORDER — METOPROLOL TARTRATE 25 MG PO TABS
25.0000 mg | ORAL_TABLET | Freq: Once | ORAL | Status: AC
  Administered 2014-07-20: 25 mg via ORAL
  Filled 2014-07-20: qty 1

## 2014-07-20 MED ORDER — SUCCINYLCHOLINE CHLORIDE 20 MG/ML IJ SOLN
INTRAMUSCULAR | Status: DC | PRN
  Administered 2014-07-20: 100 mg via INTRAVENOUS

## 2014-07-20 MED ORDER — CEFAZOLIN SODIUM-DEXTROSE 2-3 GM-% IV SOLR
INTRAVENOUS | Status: AC
  Filled 2014-07-20: qty 50

## 2014-07-20 MED ORDER — LACTATED RINGERS IV SOLN
INTRAVENOUS | Status: DC
  Administered 2014-07-20 (×2): via INTRAVENOUS

## 2014-07-20 MED ORDER — 0.9 % SODIUM CHLORIDE (POUR BTL) OPTIME
TOPICAL | Status: DC | PRN
  Administered 2014-07-20: 1000 mL

## 2014-07-20 MED ORDER — CEFAZOLIN SODIUM-DEXTROSE 2-3 GM-% IV SOLR
INTRAVENOUS | Status: DC | PRN
  Administered 2014-07-20: 2 g via INTRAVENOUS

## 2014-07-20 SURGICAL SUPPLY — 30 items
BANDAGE ELASTIC 4 VELCRO ST LF (GAUZE/BANDAGES/DRESSINGS) IMPLANT
BANDAGE ELASTIC 6 VELCRO ST LF (GAUZE/BANDAGES/DRESSINGS) IMPLANT
BNDG GAUZE ELAST 4 BULKY (GAUZE/BANDAGES/DRESSINGS) IMPLANT
CANISTER SUCTION 2500CC (MISCELLANEOUS) ×2 IMPLANT
COVER SURGICAL LIGHT HANDLE (MISCELLANEOUS) ×2 IMPLANT
DRAPE INCISE IOBAN 66X45 STRL (DRAPES) IMPLANT
DRAPE ORTHO SPLIT 77X108 STRL (DRAPES) ×1
DRAPE PROXIMA HALF (DRAPES) ×2 IMPLANT
DRAPE SURG ORHT 6 SPLT 77X108 (DRAPES) ×1 IMPLANT
DRSG VAC ATS MED SENSATRAC (GAUZE/BANDAGES/DRESSINGS) ×2 IMPLANT
ELECT REM PT RETURN 9FT ADLT (ELECTROSURGICAL) ×2
ELECTRODE REM PT RTRN 9FT ADLT (ELECTROSURGICAL) ×1 IMPLANT
GAUZE SPONGE 4X4 12PLY STRL (GAUZE/BANDAGES/DRESSINGS) IMPLANT
GLOVE BIO SURGEON STRL SZ7 (GLOVE) ×2 IMPLANT
GLOVE BIOGEL PI IND STRL 7.5 (GLOVE) ×1 IMPLANT
GLOVE BIOGEL PI INDICATOR 7.5 (GLOVE) ×1
GOWN STRL REUS W/ TWL LRG LVL3 (GOWN DISPOSABLE) ×2 IMPLANT
GOWN STRL REUS W/TWL LRG LVL3 (GOWN DISPOSABLE) ×2
KIT BASIN OR (CUSTOM PROCEDURE TRAY) ×2 IMPLANT
KIT ROOM TURNOVER OR (KITS) ×2 IMPLANT
NS IRRIG 1000ML POUR BTL (IV SOLUTION) ×2 IMPLANT
PACK GENERAL/GYN (CUSTOM PROCEDURE TRAY) ×2 IMPLANT
PAD ARMBOARD 7.5X6 YLW CONV (MISCELLANEOUS) ×4 IMPLANT
PAD NEG PRESSURE SENSATRAC (MISCELLANEOUS) ×2 IMPLANT
SUT ETHILON 3 0 PS 1 (SUTURE) IMPLANT
SUT MNCRL AB 4-0 PS2 18 (SUTURE) IMPLANT
SUT VIC AB 2-0 CTX 36 (SUTURE) IMPLANT
SUT VIC AB 3-0 SH 27 (SUTURE)
SUT VIC AB 3-0 SH 27X BRD (SUTURE) IMPLANT
WATER STERILE IRR 1000ML POUR (IV SOLUTION) IMPLANT

## 2014-07-20 NOTE — Transfer of Care (Signed)
Immediate Anesthesia Transfer of Care Note  Patient: Delfino LovettRebecca Cosgriff  Procedure(s) Performed: Procedure(s): IRRIGATION AND DEBRIDEMENT EXTREMITY (Left) APPLICATION OF WOUND VAC (Left)  Patient Location: PACU  Anesthesia Type:General  Level of Consciousness: awake  Airway & Oxygen Therapy: Patient Spontanous Breathing and Patient connected to nasal cannula oxygen  Post-op Assessment: Report given to RN and Post -op Vital signs reviewed and stable  Post vital signs: Reviewed and stable  Last Vitals:  Filed Vitals:   07/20/14 1205  BP: 166/59  Pulse: 80  Temp:   Resp:     Complications: No apparent anesthesia complications

## 2014-07-20 NOTE — Op Note (Signed)
    OPERATIVE NOTE   PROCEDURE: 1. Washout of left thigh wound 2. Placement of negative pressure dressing  PRE-OPERATIVE DIAGNOSIS: Left thigh hematoma  POST-OPERATIVE DIAGNOSIS: same as above   SURGEON: Leonides SakeBrian Chen, MD  ANESTHESIA: general  ESTIMATED BLOOD LOSS: 50 cc  FINDING(S): 1.  Medial posterior flap adherent to subcutaneous tissue 2.  Expected skin slough overlying posterior flap 3.  Continued anterior flap with evidence of liquefaction of prior hematoma  SPECIMEN(S):  none  INDICATIONS:   Pamela LovettRebecca Perez is a 64 y.o. female who presents with s/p fall which resulted in large left distal thigh hematoma which cause skin necrosis.  I previously stented the Left superficial femoral artery with a covered stent to cover the feeding branches to the hematoma and then evacuated the large hematoma..  DESCRIPTION: After obtaining full informed written consent, the patient was brought back to the operating room and placed supine upon the operating table.  The patient received IV antibiotics prior to induction.  After obtaining adequate anesthesia, the patient was prepped and draped in the standard fashion for: left thigh exploration.  The prior wound VAC had been already removed.  The medial posterior flap appeared to be adherent to the subcutaneous tissue.   There were portion of the medial posterior flap that sloughed skin.  I bluntedly debrided all the non-viable skin.  The underlying tissue appeared viable.   I elected to not dissect out this plane.  Anteriorly the prior dissected tissue flap remained non-adherent but viable.  There was evidence of liquefaction of some of the prior hematoma.  No active bleeding was present.  I washed out this anterior portion of the distal thigh.  Due to the continued anterior tissue dissection plane, I elected to reapply the [facility] Morgan West[facility]AC dressing.  I sharply fashioned a VAC sponge, 12 cm x 4 cm x 2 cm, for this wound.  I affixed the sponge with adhesive strips  and then cut a hole for the lilypad which was affixed.  I connected VAC hose to the VAC pump and activated it at 125 continuous.  The dressing immediately became adherent.  I then turn my attention briefly to the prepatellar laceration.  There appeared to be some ballotable fluid.  I briefly tried to open what appeared be Dermabond enforced incision, but the Dermabond did not easily release.  In order to avoid any further damage to this area, I elected to stop this portion of the case.  COMPLICATIONS: none  CONDITION: stable   Leonides SakeBrian Chen, MD [facility] [place] Office: [phone] Pager: [phone]  07/20/2014, 5:37 PM

## 2014-07-20 NOTE — Anesthesia Preprocedure Evaluation (Signed)
Anesthesia Evaluation  Patient identified by MRN, date of birth, ID band Patient awake    Reviewed: Allergy & Precautions, NPO status , Patient's Chart, lab work & pertinent test results  Airway Mallampati: I       Dental   Pulmonary    Pulmonary exam normal       Cardiovascular hypertension, + Peripheral Vascular Disease Normal cardiovascular exam    Neuro/Psych    GI/Hepatic   Endo/Other    Renal/GU      Musculoskeletal   Abdominal   Peds  Hematology   Anesthesia Other Findings   Reproductive/Obstetrics                             Anesthesia Physical Anesthesia Plan  ASA: III  Anesthesia Plan: General   Post-op Pain Management:    Induction: Intravenous  Airway Management Planned: Oral ETT  Additional Equipment:   Intra-op Plan:   Post-operative Plan: Extubation in OR  Informed Consent: I have reviewed the patients History and Physical, chart, labs and discussed the procedure including the risks, benefits and alternatives for the proposed anesthesia with the patient or authorized representative who has indicated his/her understanding and acceptance.     Plan Discussed with: CRNA, Anesthesiologist and Surgeon  Anesthesia Plan Comments:         Anesthesia Quick Evaluation

## 2014-07-20 NOTE — Anesthesia Procedure Notes (Signed)
Procedure Name: Intubation Date/Time: 07/20/2014 3:36 PM Performed by: Gavin PoundLOWDER, Corben Auzenne J Pre-anesthesia Checklist: Patient identified, Emergency Drugs available, Suction available, Patient being monitored and Timeout performed Patient Re-evaluated:Patient Re-evaluated prior to inductionOxygen Delivery Method: Circle system utilized Preoxygenation: Pre-oxygenation with 100% oxygen Intubation Type: IV induction and Cricoid Pressure applied Ventilation: Mask ventilation without difficulty Laryngoscope Size: Miller and 2 Grade View: Grade III Tube type: Oral Tube size: 7.0 mm Number of attempts: 2 Placement Confirmation: ETT inserted through vocal cords under direct vision and breath sounds checked- equal and bilateral Secured at: 20 cm Tube secured with: Tape Dental Injury: Teeth and Oropharynx as per pre-operative assessment

## 2014-07-20 NOTE — Progress Notes (Signed)
OT Cancellation Note  Patient Details Name: Pamela Perez MRN: [id number] DOB: 02/24/1950   Cancelled Treatment:    Reason Eval/Treat Not Completed: Patient at procedure or test/ unavailable. Pt in OR for I&D of pre-patellar wound. Acute OT will follow up as available.   Nena JordanMiller, Emilia Kayes M   Carney LivingLeeAnn Marie Keny Donald, OTR/L Occupational Therapist [phone] (pager)  07/20/2014, 7:48 AM

## 2014-07-20 NOTE — Interval H&P Note (Signed)
History and Physical Interval Note:  07/20/2014 7:32 AM  Pamela Perez  has presented today for surgery, with the diagnosis of Left thigh hematoma L76.22  The various methods of treatment have been discussed with the patient and family. After consideration of risks, benefits and other options for treatment, the patient has consented to  Procedure(s): IRRIGATION AND DEBRIDEMENT EXTREMITY (Left) APPLICATION OF WOUND VAC (Left) as a surgical intervention .  The patient's history has been reviewed, patient examined, no change in status, stable for surgery.  I have reviewed the patient's chart and labs.  Questions were answered to the patient's satisfaction.     Leonides Sakehen, Pamela Perez

## 2014-07-20 NOTE — Anesthesia Postprocedure Evaluation (Signed)
  Anesthesia Post-op Note  Patient: Pamela Perez  Procedure(s) Performed: Procedure(s): IRRIGATION AND DEBRIDEMENT EXTREMITY (Left) APPLICATION OF WOUND VAC (Left)  Patient Location: PACU  Anesthesia Type: General   Level of Consciousness: awake, alert  and oriented  Airway and Oxygen Therapy: Patient Spontanous Breathing  Post-op Pain: mild  Post-op Assessment: Post-op Vital signs reviewed  Post-op Vital Signs: Reviewed  Last Vitals:  Filed Vitals:   07/20/14 1700  BP: 131/67  Pulse: 69  Temp:   Resp: 21    Complications: No apparent anesthesia complications

## 2014-07-20 NOTE — Progress Notes (Signed)
   Daily Progress Note  Based on my intraoperative findings:  1.  Continue wound VAC to help evacuate liquifying hematoma.  2.  Observe medial posterior flap for development of any further necrosis  3.  Oral antibiotics for possible pre-patellar cellulitis  - Will d/c home tomorrow with Wound VAC (Wound size: 12 cm x 4 cm x 2 cm  - F/U in office in 2 weeks   Leonides SakeBrian Hermelinda Diegel, MD [facility] [place] Office: [phone] Pager: [phone]  07/20/2014, 5:49 PM

## 2014-07-21 ENCOUNTER — Encounter (HOSPITAL_COMMUNITY): Payer: Self-pay | Admitting: Vascular Surgery

## 2014-07-21 MED ORDER — POTASSIUM CHLORIDE CRYS ER 20 MEQ PO TBCR
40.0000 meq | EXTENDED_RELEASE_TABLET | Freq: Once | ORAL | Status: AC
  Administered 2014-07-21: 40 meq via ORAL
  Filled 2014-07-21: qty 2

## 2014-07-21 MED ORDER — HYDROCODONE-ACETAMINOPHEN 5-325 MG PO TABS: 1.0000 | ORAL_TABLET | Freq: Four times a day (QID) | ORAL | Status: DC | PRN

## 2014-07-21 MED ORDER — CEPHALEXIN 500 MG PO CAPS: 500.0000 mg | ORAL_CAPSULE | Freq: Two times a day (BID) | ORAL | Status: DC

## 2014-07-21 NOTE — Progress Notes (Signed)
  [facility] Progress Note  07/21/2014 8:52 AM 1 Day Post-Op  Subjective: Having some soreness.    Filed Vitals:   07/21/14 0531  BP: 123/45  Pulse: 65  Temp: 98.1 F (36.7 C)  Resp: 20    Physical Exam: Left thigh VAC dressing seal intact. Surrounding ecchymosis. Pre-patellar incision clean and intact with very mild erythema.  2+ left DP pulse.   CBC    Component Value Date/Time   WBC 8.0 07/20/2014 0525   RBC 3.66* 07/20/2014 0525   HGB 10.9* 07/20/2014 0525   HCT 32.1* 07/20/2014 0525   PLT 226 07/20/2014 0525   MCV 87.7 07/20/2014 0525   MCH 29.8 07/20/2014 0525   MCHC 34.0 07/20/2014 0525   RDW 13.4 07/20/2014 0525   LYMPHSABS 1.3 07/16/2014 0539   MONOABS 1.0 07/16/2014 0539   EOSABS 0.0 07/16/2014 0539   BASOSABS 0.0 07/16/2014 0539    BMET    Component Value Date/Time   NA 139 07/20/2014 0525   K 3.5 07/20/2014 0525   CL 101 07/20/2014 0525   CO2 26 07/20/2014 0525   GLUCOSE 92 07/20/2014 0525   BUN 7 07/20/2014 0525   CREATININE 0.77 07/20/2014 0525   CALCIUM 8.7* 07/20/2014 0525   GFRNONAA >60 07/20/2014 0525   GFRAA >60 07/20/2014 0525    INR    Component Value Date/Time   INR 1.13 07/16/2014 0832     Intake/Output Summary (Last 24 hours) at 07/21/14 0852 Last data filed at 07/20/14 1659  Gross per 24 hour  Intake    980 ml  Output     25 ml  Net    955 ml     Assessment:  64 y.o. female is s/p:  Washout of left thigh wound and placement of negative pressure dressing.  1 Day Post-Op  Plan: -D/C home when home negative pressure unit is available. -Will prescribe keflex for possible pre-patellar cellulitis. -F/u with Dr. Imogene Burnhen in 2 weeks.     Pamela BergerKimberly Nezzie Manera, PA-C [facility] Office: [phone] Pager: [phone] 07/21/2014 8:52 AM

## 2014-07-21 NOTE — Care Management Note (Addendum)
Case Management Note  Patient Details  Name: Pamela LovettRebecca Zavadil MRN: [id number] Date of Birth: 03/17/1950  Subjective/Objective:    Left thigh hematoma              Action/Plan:   Expected Discharge Date:      07/21/2014            Expected Discharge Plan:  Home w Home Health Services  In-House Referral:     Discharge planning Services  CM Consult  Post Acute Care Choice:  Home Health Choice offered to:  Patient  DME Arranged:  Walker rolling, Vac DME Agency:  [facility]  HH Arr[facility]  [facility]  Status of Service:  Completed, signed off  Medicare Important Message Given:  No Date Medicare IM Given:    Medicare IM give by:    Date Additional Medicare IM Given:    Additional Medicare Important Message give by:     If discussed at Long Length of Stay Meetings, dates discussed:    Additional Comments: 07/21/2014 1130 NCM contacted [facility] with updated info on pt's PCP. [facility] [place] [facility]. New PCP will be Dr. Freddi CheAlvin Mccuiston. Appt arranged for 07/31/2014 at 11 am. Wound vac scheduled for delivery today to pt's room. [facility] planned soc for Monday. Pt had wound vac dressing change today. Will fax dc summary to [facility] when available. Isidoro DonningAlesia Brette Cast RN CCM Case Mgmt phone [phone]  07/21/2014 1055 Contacted [facility] for RW for home. Isidoro DonningAlesia Javani Spratt RN CCM Case Mgmt phone [phone]  07/21/2014, 10:50 AM NCM spoke to pt and gave permission to speak to husband, Mr Thedore MinsGraybeal. [facility] and [facility] Requesting [facility]W for home. Spoke to [facility] wound vac rep, and orders, op note, facesheet and H&P faxed to [facility] and [facility]. Spoke to [facility] and they will verify insurance.  Elliot CousinShavis, Deborah Dondero Ellen, RN 07/21/2014, 10:50 AM

## 2014-07-21 NOTE — Consult Note (Signed)
WOC wound consult note Reason for Consult: Vac machine alarming "blockage," pt is followed by [facility]VS team.  Requested to change Vac dressing to left inner thigh wound. Wound type: Full thickness post-op wound Measurement: 12X5X.8cm Wound bed: 100% beefy red with muscles visible Drainage (amount, consistency, odor) Small amt pink drainage in cannister Periwound: Intact skin surrounding Dressing procedure/placement/frequency: Pt medicated for pain prior to procedure and tolerated with minimal amt discomfort.  Applied one piece black foam to 125mm cont suction. Pt awaiting the delivery of [facility] Vac for home use and plans to discharge home today.  Bedside nurse can change dressing Q M/W/F if she remains in the hosp[ital. Please re-consult if further assistance is needed.  Thank-you,  Cammie Mcgeeawn Esti Demello MSN, RN, CWOCN, [facility], CNS [phone]

## 2014-07-21 NOTE — Progress Notes (Signed)
Physical Therapy Treatment Patient Details Name: Pamela Perez MRN: [id number] DOB: 1950/03/30 Today's Date: 07/21/2014    History of Present Illness Patient is a 64 y/o female s/p fall from golf cart with L distal thigh hematoma. Pt s/p Left Leg Angiogram, Stenting of Left Superficial Femoral Artery, Repair of Pre-Patella Laceration, Evacuation of Left Thigh Hematoma. To OR 6/2 for I&D of pre-patellar wound. PMH includes HTN.     PT Comments    Pt tolerated mobility and ambulation well (despite return to OR and incr pain LLE). Pt with good recall of tips to improve her gait and exercise technique to incr knee flexion. Discussed transfer into truck for drive home with pt and husband. Plan for pt to step up onto running board backwards with good foot and sit on back seat. Pt then will slide backwards onto bench seat until she is seated behind front passenger seat and LLE is elevated on remainder of bench seat. Pt agreeable to HHPT to progress her ambulation and prepare her for return to work.    Follow Up Recommendations  Home health PT;Supervision - Intermittent     Equipment Recommendations  Rolling walker with 5" wheels    Recommendations for Other Services       Precautions / Restrictions Precautions Precautions: Fall Restrictions Weight Bearing Restrictions: No    Mobility  Bed Mobility Overal bed mobility: Needs Assistance Bed Mobility: Supine to Sit;Sit to Supine     Supine to sit: Min guard Sit to supine: Min guard   General bed mobility comments: incr time and effort with near need for assist  Transfers Overall transfer level: Modified independent Equipment used: Rolling walker (2 wheeled) Transfers: Sit to/from Stand           General transfer comment: pt demonstrated safe use of RW  Ambulation/Gait Ambulation/Gait assistance: Supervision Ambulation Distance (Feet): 120 Feet Assistive device: Rolling walker (2 wheeled) Gait Pattern/deviations:  Step-through pattern;Decreased step length - left;Decreased dorsiflexion - left;Antalgic Gait velocity: slower   General Gait Details: pt tends to keep LLE stiff and move as one unit with hip hike, however able to recall how to focus on knee flexion followed by heelstrike; pt having to decr velocity significantly with frequent vc to focus on toe-off, knee flexion/swing through, and heel strike   Stairs Stairs:  (Demonstrated up one step backwards (for truck entrance, home)          Wheelchair Mobility    Modified Rankin (Stroke Patients Only)       Balance             Standing balance-Leahy Scale: Fair                      Cognition Arousal/Alertness: Awake/alert Behavior During Therapy: WFL for tasks assessed/performed Overall Cognitive Status: Within Functional Limits for tasks assessed                      Exercises General Exercises - Lower Extremity Ankle Circles/Pumps: Both;10 reps;Seated Heel Slides: AROM;Left;5 reps;Seated (slide heel under knee (knee flexion) hold 10-30 seconds)    General Comments        Pertinent Vitals/Pain Pain Assessment: 0-10 Pain Score: 5  Pain Location: Lt thigh Pain Intervention(s): Limited activity within patient's tolerance;Monitored during session;Premedicated before session;Repositioned    Home Living                      Prior Function  PT Goals (current goals can now be found in the care plan section) Acute Rehab PT Goals Patient Stated Goal: to go home Time For Goal Achievement: 07/31/14 Potential to Achieve Goals: Good Progress towards PT goals: Goals met/education completed, patient discharged from PT    Frequency  Min 4X/week    PT Plan Current plan remains appropriate    Co-evaluation             End of Session   Activity Tolerance: Patient tolerated treatment well Patient left: with call bell/phone within reach;in bed (to bed for VAC dressing change)      Time: 6812-7517 PT Time Calculation (min) (ACUTE ONLY): 30 min  Charges:  $Gait Training: 8-22 mins $Therapeutic Exercise: 8-22 mins                    G Codes:      Czar Ysaguirre 08/07/14, 12:08 PM Pager [phone]
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
## 2014-07-22 NOTE — Care Management Note (Signed)
Case Management Note  Patient Details  Name: Pamela Perez MRN: [id number] Date of Birth: 04/20/1950  Subjective/Objective: [facility] call from [facility] yesterday w/[facility].                  Action/Plan:   Expected Discharge Date:                  Expected Discharge Plan:  Home w Home Health Services  In-House Referral:     Discharge planning Services  CM Consult  Post Acute Care Choice:  Home Health Choice offered to:  Patient  DME Arranged:  Walker rolling, Vac DME Agency:  [facility]  [facility]  [facility] Agency:  Other - See comment, Loralee Pacas[facility]ennybryn at [employer]  Status of Service:  Completed, signed off  Medicare Important Message Given:  No Date Medicare IM Given:    Medicare IM give by:    Date Additional Medicare IM Given:    Additional Medicare Important Message give by:     If discussed at Long Length of Stay Meetings, dates discussed:    Additional Comments:  Lanier ClamMahabir, Teiana Hajduk, RN 07/22/2014, 1:32 PM

## 2014-07-24 ENCOUNTER — Telehealth: Payer: Self-pay | Admitting: Vascular Surgery

## 2014-07-24 NOTE — Telephone Encounter (Signed)
-----   Message from Sharee PimpleMarilyn K McChesney, RN sent at 07/21/2014 11:04 AM EDT ----- Regarding: Schedule   ----- Message -----    From: Raymond GurneyKimberly A Trinh, PA-C    Sent: 07/21/2014  10:13 AM      To: Vvs Charge Pool  S/p I & D left thigh hematoma and application of wound VAC 07/20/14  F/u with Dr. Imogene Burnhen in 2 weeks  Thanks Selena BattenKim

## 2014-07-24 NOTE — Progress Notes (Signed)
Faxed D/C summary to Dr. Freddi CheAlvin Mccu[place] office. Pt has follow up appt on 07/31/2014. Faxed D/C summary to [facility] and [facility] consult note from 07/21/2014. Received call from [facility] and scheduled visit today.  Isidoro DonningAlesia Naidelin Gugliotta RN CCM Case Mgmt phone [phone]

## 2014-07-24 NOTE — Telephone Encounter (Signed)
Spoke with pts spouse to schedule, dpm °

## 2014-08-01 ENCOUNTER — Emergency Department (HOSPITAL_COMMUNITY)
Admission: EM | Admit: 2014-08-01 | Discharge: 2014-08-01 | Disposition: A | Discharge status: Home / Self Care | Payer: BLUE CROSS/BLUE SHIELD | Attending: Emergency Medicine | Admitting: Emergency Medicine

## 2014-08-01 ENCOUNTER — Encounter: Payer: Self-pay | Admitting: Family

## 2014-08-01 ENCOUNTER — Encounter (HOSPITAL_COMMUNITY): Payer: Self-pay | Admitting: Emergency Medicine

## 2014-08-01 DIAGNOSIS — Z4889 Encounter for other specified surgical aftercare: Secondary | ICD-10-CM

## 2014-08-01 DIAGNOSIS — I10 Essential (primary) hypertension: Secondary | ICD-10-CM | POA: Insufficient documentation

## 2014-08-01 DIAGNOSIS — Z4801 Encounter for change or removal of surgical wound dressing: Secondary | ICD-10-CM | POA: Diagnosis not present

## 2014-08-01 DIAGNOSIS — Z792 Long term (current) use of antibiotics: Secondary | ICD-10-CM | POA: Diagnosis not present

## 2014-08-01 DIAGNOSIS — Z79899 Other long term (current) drug therapy: Secondary | ICD-10-CM | POA: Insufficient documentation

## 2014-08-01 NOTE — ED Notes (Signed)
Dr. Edilia Bo at bedside placing wet to dry dressing.

## 2014-08-01 NOTE — Progress Notes (Signed)
Pt to be discharged after [facility] care updated with new dressing change orders. Reiterated No Adhesive. Para March updated with new orders, as well as faxed with confirmation.
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
## 2014-08-01 NOTE — Progress Notes (Signed)
  [facility] Progress Note  HPI: Asked to see in ED regarding left thigh wound. Patient is s/p I & D of left thigh hematoma and angioplasty and stenting of left SFA by Dr. Imogene Burn on 07/16/14. She was taken to the operating room for wash out and debridement of residual skin of her left thigh on 07/20/14. She was discharged on 07/21/14 with home wound VAC and home health dressing changes. During dressing change yesterday, the home health RN reported concerns with the wound stating possible infection and concerning skin edges. A wet to dry dressing was placed and the patient was instructed to report to the [facility] ED.    Objective Filed Vitals:   08/01/14 1245  BP: 126/51  Pulse:   Temp:   Resp:    No intake or output data in the 24 hours ending 08/01/14 1313  Left thigh wound with red granulation tissue. No purulence or drainage seen in wound. Some pale fat at medial skin edge. Skin surrounding wound is red and indurated.  Left pre-patellar wound sutures intact with overlying scab. No erythema seen.   Assessment/Planning: 64 y.o. female is s/p: I & D of left thigh hematoma, angioplasty and stenting of left SFA 07/16/14, washout and debridement of left thigh hematoma 07/20/14  The patient's wound appears to be healing well. No evidence of infection. The skin surrounding the wound is erythematous and irritated secondary to adhesives. Discontinue VAC therapy. Daily dressing changes with hydrogel and moist 4 x 4's, wrap with kerlix and ACE. Patient has follow up in office on Friday, 08/04/14.    Raymond Gurney 08/01/2014 1:13 PM --  Laboratory CBC    Component Value Date/Time   WBC 8.0 07/20/2014 0525   HGB 10.9* 07/20/2014 0525   HCT 32.1* 07/20/2014 0525   PLT 226 07/20/2014 0525    BMET    Component Value Date/Time   NA 139 07/20/2014 0525   K 3.5 07/20/2014 0525   CL 101 07/20/2014 0525   CO2 26 07/20/2014 0525   GLUCOSE 92 07/20/2014 0525   BUN 7 07/20/2014  0525   CREATININE 0.77 07/20/2014 0525   CALCIUM 8.7* 07/20/2014 0525   GFRNONAA >60 07/20/2014 0525   GFRAA >60 07/20/2014 0525    COAG Lab Results  Component Value Date   INR 1.13 07/16/2014   No results found for: PTT  Antibiotics Anti-infectives    None       Maris Berger, PA-C [facility] Office: [phone] Pager: [phone] 08/01/2014 1:13 PM
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
## 2014-08-04 ENCOUNTER — Ambulatory Visit (INDEPENDENT_AMBULATORY_CARE_PROVIDER_SITE_OTHER): Payer: Self-pay | Admitting: Family

## 2014-08-04 ENCOUNTER — Encounter: Payer: Self-pay | Admitting: Family

## 2014-08-04 VITALS — BP 114/62 | HR 63 | Resp 16 | Ht 59.0 in | Wt 142.0 lb

## 2014-08-04 DIAGNOSIS — Z9889 Other specified postprocedural states: Secondary | ICD-10-CM

## 2014-08-04 DIAGNOSIS — S79922D Unspecified injury of left thigh, subsequent encounter: Secondary | ICD-10-CM

## 2014-08-04 DIAGNOSIS — Z959 Presence of cardiac and vascular implant and graft, unspecified: Secondary | ICD-10-CM
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
## 2014-08-22 ENCOUNTER — Encounter: Payer: Self-pay | Admitting: Vascular Surgery

## 2014-08-25 ENCOUNTER — Encounter: Payer: Self-pay | Admitting: Vascular Surgery

## 2014-08-25 ENCOUNTER — Ambulatory Visit (INDEPENDENT_AMBULATORY_CARE_PROVIDER_SITE_OTHER): Payer: Self-pay | Admitting: Vascular Surgery

## 2014-08-25 VITALS — BP 137/82 | HR 63 | Ht 59.0 in | Wt 146.8 lb

## 2014-08-25 DIAGNOSIS — S7012XD Contusion of left thigh, subsequent encounter: Secondary | ICD-10-CM

## 2014-08-25 MED ORDER — CLOPIDOGREL BISULFATE 75 MG PO TABS
75.0000 mg | ORAL_TABLET | Freq: Every day | ORAL | Status: DC
Start: 2014-08-25 — End: 2014-09-18

## 2014-08-25 NOTE — Progress Notes (Signed)
    Postoperative Visit   History of Present Illness  Delfino LovettRebecca Fort is a 64 y.o. year old female who presents for postoperative follow-up for:   07/16/14 Large skin necrosing L hematoma s/p fall 1. Right common femoral artery cannulation with ultrasound guidance 2. Second order arterial selection 3. Left leg angiogram 4. Angioplasty and stenting Left superficial femoral artery (Viabahn 6 mm x 50 mm) 5. Washout of pre-patellar laceration 6. Simple repair of pre-patellar laceration 7. Incision and drainage of left thigh hematoma  07/20/14 Wound washout, VAC placement  L thigh wound is healing, prepatellar wound is healing, PT is starting increased mobilization   For VQI Use Only  PRE-ADM LIVING: Home  AMB STATUS: Ambulatory  Physical Examination  Filed Vitals:   08/25/14 0831  BP: 137/82  Pulse: 63   LLE: L distal thigh with extensive granulation, nearly at skin level, prepatellar incision healed except for residual scab   Medical Decision Making  Delfino LovettRebecca Boggus is a 64 y.o. year old female who presents s/p fall requiring L thigh hematoma evacuation, L SFA PTA+S to control bleeding, repair of pre-patellar laceration . I except her L thigh wound to be healed in the next month.  She will follow up with me to check her wound in that time. I discussed in depth with the patient the nature of atherosclerosis, and emphasized the importance of maximal medical management including strict control of blood pressure, blood glucose, and lipid levels, obtaining regular exercise, and cessation of smoking.  The patient is aware that without maximal medical management the underlying atherosclerotic disease process will progress, limiting the benefit of any interventions. The patient's surveillance will included ABI and LLE duplex studies which will be completed in: 3 months, at which time the patient will be re-evaluated.   I emphasized the importance of routine surveillance of the  patient's bypass, as the vascular surgery literature emphasize the improved patency possible with assisted primary patency procedures versus secondary patency procedures. The patient agrees to participate in their maximal medical care and routine surveillance. I added Plavix 75 mg PO qd as an adjunct to maintaining patency of her L SFA stent.  Thank you for allowing us to participate in this patient's care.  Leonides SakeBrian Liahna Brickner, MD [facility] [place] Office: [phone] Pager: [phone]  08/25/2014, 8:55 AM

## 2014-09-18 ENCOUNTER — Other Ambulatory Visit: Payer: Self-pay | Admitting: *Deleted

## 2014-09-18 DIAGNOSIS — I739 Peripheral vascular disease, unspecified: Secondary | ICD-10-CM

## 2014-09-18 MED ORDER — CLOPIDOGREL BISULFATE 75 MG PO TABS: 75.0000 mg | ORAL_TABLET | Freq: Every day | ORAL | Status: DC

## 2014-09-21 ENCOUNTER — Encounter: Payer: Self-pay | Admitting: Vascular Surgery

## 2014-09-22 ENCOUNTER — Encounter: Payer: Self-pay | Admitting: Vascular Surgery

## 2014-09-22 ENCOUNTER — Ambulatory Visit (INDEPENDENT_AMBULATORY_CARE_PROVIDER_SITE_OTHER): Payer: Self-pay | Admitting: Vascular Surgery

## 2014-09-22 VITALS — BP 150/84 | HR 70 | Temp 98.0°F | Resp 14 | Ht 59.0 in | Wt 147.0 lb

## 2014-09-22 DIAGNOSIS — S7012XD Contusion of left thigh, subsequent encounter: Secondary | ICD-10-CM

## 2014-09-22 NOTE — Progress Notes (Signed)
   Postoperative Visit   History of Present Illness  Pamela Perez is a 64 y.o. (February 20, 1950) female who presents for postoperative follow-up for:   07/16/14 Large skin necrosing L hematoma s/p fall 1. Right common femoral artery cannulation with ultrasound guidance 2. Second order arterial selection 3. Left leg angiogram 4. Angioplasty and stenting Left superficial femoral artery (Viabahn 6 mm x 50 mm) 5. Washout of pre-patellar laceration 6. Simple repair of pre-patellar laceration 7. Incision and drainage of left thigh hematoma  07/20/14 Wound washout, VAC placement  L thigh wound has healed to skin level.  Prepatellar wound is nearly healed.  Pt ambulates with cane currently  For VQI Use Only  PRE-ADM LIVING: Home  AMB STATUS: Ambulatory  Physical Examination  Filed Vitals:   09/22/14 0846 09/22/14 0850  BP: 160/76 150/84  Pulse: 73 70  Temp: 98 F (36.7 C)   Resp: 14   Height:  (1.499 m)   Weight: 147 lb (66.679 kg)   SpO2: 98%      LLE: L distal thigh incision healed with re-epithelization in process, prepatellar incision healed except for residual scab   Medical Decision Making  Pamela Perez is a 64 y.o. (26-Nov-1950) female  who presents s/p fall requiring L thigh hematoma evacuation, L SFA PTA+S to control bleeding, repair of pre-patellar laceration .  I expect her L thigh wound to be healed in the next month. She will follow up with me to check her wound in that time.  Thank you for allowing [name] to participate in this patient's care.  Leonides Sake, MD [facility] [place] Office: [phone] Pager: [phone]

## 2014-10-19 ENCOUNTER — Encounter: Payer: Self-pay | Admitting: Vascular Surgery

## 2014-10-20 ENCOUNTER — Ambulatory Visit: Payer: BLUE CROSS/BLUE SHIELD | Admitting: Vascular Surgery

## 2014-10-30 ENCOUNTER — Encounter: Payer: Self-pay | Admitting: Vascular Surgery

## 2014-10-31 ENCOUNTER — Encounter: Payer: Self-pay | Admitting: Vascular Surgery

## 2014-10-31 ENCOUNTER — Ambulatory Visit (HOSPITAL_COMMUNITY)
Admission: RE | Admit: 2014-10-31 | Discharge: 2014-10-31 | Disposition: A | Discharge status: Home / Self Care | Payer: BLUE CROSS/BLUE SHIELD | Source: Ambulatory Visit | Attending: Vascular Surgery | Admitting: Vascular Surgery

## 2014-10-31 ENCOUNTER — Ambulatory Visit (INDEPENDENT_AMBULATORY_CARE_PROVIDER_SITE_OTHER): Payer: Self-pay | Admitting: Vascular Surgery

## 2014-10-31 ENCOUNTER — Ambulatory Visit (INDEPENDENT_AMBULATORY_CARE_PROVIDER_SITE_OTHER)
Admission: RE | Admit: 2014-10-31 | Discharge: 2014-10-31 | Disposition: A | Discharge status: Home / Self Care | Payer: BLUE CROSS/BLUE SHIELD | Source: Ambulatory Visit | Attending: Vascular Surgery | Admitting: Vascular Surgery

## 2014-10-31 VITALS — BP 146/83 | HR 68 | Temp 98.4°F | Ht 59.0 in | Wt 151.2 lb

## 2014-10-31 DIAGNOSIS — Z9889 Other specified postprocedural states: Secondary | ICD-10-CM

## 2014-10-31 DIAGNOSIS — I739 Peripheral vascular disease, unspecified: Secondary | ICD-10-CM

## 2014-10-31 DIAGNOSIS — Z9862 Peripheral vascular angioplasty status: Secondary | ICD-10-CM

## 2014-10-31 DIAGNOSIS — S7012XD Contusion of left thigh, subsequent encounter: Secondary | ICD-10-CM

## 2014-10-31 NOTE — Progress Notes (Signed)
    Postoperative Visit   History of Present Illness  Pamela Perez is a 64 y.o. ([birth year]) female  who presents for postoperative follow-up for:   07/16/14 Large skin necrosing L hematoma s/p fall 1. Right common femoral artery cannulation with ultrasound guidance 2. Second order arterial selection 3. Left leg angiogram 4. Angioplasty and stenting Left superficial femoral artery (Viabahn 6 mm x 50 mm) 5. Washout of pre-patellar laceration 6. Simple repair of pre-patellar laceration 7. Incision and drainage of left thigh hematoma  07/20/14 Wound washout, VAC placement  L thigh wound is nearly healed, prepatellar wound is healed.     For VQI Use Only  PRE-ADM LIVING: Home  AMB STATUS: Ambulatory  Physical Examination Filed Vitals:   10/31/14 0936 10/31/14 0938  BP: 149/77 146/83  Pulse: 68   Temp: 98.4 F (36.9 C)   TempSrc: Oral   Height:  (1.499 m)   Weight: 151 lb 3.2 oz (68.584 kg)   SpO2: 97%    LLE: L distal thigh healed except for skin, prepatellar incision healed   Non-Invasive Vascular Imaging  ABI (Date: 10/31/2014) Ordered  LLE arterial duplex(Date: 10/31/2014) orderd  Medical Decision Making  Pamela Perez is a 64 y.o. (March 17, 1950) female who presents s/p fall requiring L thigh hematoma evacuation, L SFA PTA+S to control bleeding, repair of pre-patellar laceration   Patient is nearly 3 months out for the original procedure, will need ABI and LLE arterial duplex today to evaluate the stent.  I discussed in depth with the patient the nature of atherosclerosis, and emphasized the importance of maximal medical management including strict control of blood pressure, blood glucose, and lipid levels, obtaining regular exercise, and cessation of smoking. The patient is aware that without maximal medical management the underlying atherosclerotic disease process will progress, limiting the benefit of any interventions.  I emphasized the importance of  routine surveillance of the patient's bypass, as the vascular surgery literature emphasize the improved patency possible with assisted primary patency procedures versus secondary patency procedures.  The patient agrees to participate in their maximal medical care and routine surveillance.  I added Plavix 75 mg PO qd as an adjunct to maintaining patency of her L SFA stent.  Thank you for allowing [name] to participate in this patient's care.   Leonides Sake, MD [facility] [place] Office: [phone] Pager: [phone]  08/25/2014, 8:55 AM

## 2014-11-10 ENCOUNTER — Ambulatory Visit: Payer: BLUE CROSS/BLUE SHIELD | Admitting: Vascular Surgery

## 2014-12-05 ENCOUNTER — Encounter: Payer: Self-pay | Admitting: Vascular Surgery

## 2014-12-08 ENCOUNTER — Ambulatory Visit (INDEPENDENT_AMBULATORY_CARE_PROVIDER_SITE_OTHER): Payer: BLUE CROSS/BLUE SHIELD | Admitting: Vascular Surgery

## 2014-12-08 ENCOUNTER — Encounter: Payer: Self-pay | Admitting: Vascular Surgery

## 2014-12-08 VITALS — BP 137/59 | HR 54 | Temp 97.6°F | Resp 14 | Ht 59.0 in | Wt 150.0 lb

## 2014-12-08 DIAGNOSIS — S7012XD Contusion of left thigh, subsequent encounter: Secondary | ICD-10-CM | POA: Diagnosis not present

## 2014-12-08 NOTE — Addendum Note (Signed)
Addended by: Adria DillELDRIDGE-LEWIS, Carroll Lingelbach L on: 12/08/2014 02:27 PM   Modules accepted: Orders

## 2014-12-08 NOTE — Progress Notes (Signed)
EstablishedVisit   History of Present Illness  Pamela Perez is a 64 y.o. (1950/08/19) female who presents for cc: surveillance studies after:  07/16/14 Large skin necrosing L hematoma s/p fall 1. Right common femoral artery cannulation with ultrasound guidance 2. Second order arterial selection 3. Left leg angiogram 4. Angioplasty and stenting Left superficial femoral artery (Viabahn 6 mm x 50 mm) 5. Washout of pre-patellar laceration 6. Simple repair of pre-patellar laceration 7. Incision and drainage of left thigh hematoma  07/20/14 Wound washout, VAC placement  L thigh is mostly healed except for small area which has not epithelized.  Pt notes some paraesthesia and intermittent anesthesia at the distal medial thigh.  Current Outpatient Prescriptions  Medication Sig Dispense Refill  . clopidogrel (PLAVIX) 75 MG tablet Take 1 tablet (75 mg total) by mouth daily. 90 tablet 3  . lisinopril-hydrochlorothiazide (PRINZIDE,ZESTORETIC) 20-12.5 MG per tablet Take 0.5 tablets by mouth daily.    . metoprolol tartrate (LOPRESSOR) 25 MG tablet Take 25 mg by mouth 2 (two) times daily.    . metroNIDAZOLE (METROCREAM) 0.75 % cream Apply 1 application topically daily.    . cephALEXin (KEFLEX) 500 MG capsule Take 1 capsule (500 mg total) by mouth 2 (two) times daily. (Patient not taking: Reported on 09/22/2014) 28 capsule 0  . HYDROcodone-acetaminophen (NORCO/VICODIN) 5-325 MG per tablet Take 1 tablet by mouth every 6 (six) hours as needed for moderate pain. (Patient not taking: Reported on 09/22/2014) 30 tablet 0  . lisinopril-hydrochlorothiazide (PRINZIDE,ZESTORETIC) 20-25 MG per tablet     . naproxen sodium (ANAPROX) 220 MG tablet Take 220 mg by mouth 2 (two) times daily as needed (for pain.).     No current facility-administered medications for this visit.    For VQI Use Only  PRE-ADM LIVING: Home  AMB STATUS: Ambulatory  Physical Examination  Filed Vitals:   12/08/14 1035  BP:  137/59  Pulse: 54  Temp: 97.6 F (36.4 C)  TempSrc: Oral  Resp: 14  Height:  (1.499 m)  Weight: 150 lb (68.04 kg)  SpO2: 100%   Body mass index is 30.28 kg/(m^2).  General: A&O x 3, WDWN  Pulmonary: Sym exp, good air movt, CTAB, no rales, rhonchi, & wheezing  Cardiac: RRR, Nl S1, S2, no Murmurs, rubs or gallops  Vascular:  Palpable B femoral and pedal pulses  Gastrointestinal: soft, NTND, no G/R, no HSM, no masses, no CVAT B  Musculoskeletal: M/S 5/5 throughout , Extremities without ischemic changes , L thigh incision healed except for small superficial area of incomplete epithelialization   Neurologic: Pain and light touch intact in extremities except decreased in medial distal thigh near incision (area of extensive undermining by hematoma), Motor exam as listed above   Non-Invasive Vascular Imaging  ABI (Date: 10/31/14)  R:   ABI: 1.03 (1.15)  DP: tri,   PT: tri,   TBI: 0.88  L:   ABI:  0.94 (1.18),   DP: tri,   PT: tri,   TBI: 0.85  LLE arterial duplex (10/31/14)  Widely patent stent   Medical Decision Making  Pamela Perez is a 64 y.o. ([birth year]) female  who presents s/p fall requiring L thigh hematoma evacuation, L SFA PTA+S to control bleeding, repair of pre-patellar laceration   Patient will require q6 month BLE ABI and LLE arterial duplex to follow the L SFA stent.  These stents are at significant risk of thrombosis historically.  If this pt develops neointimal hyperplasia, would aggressively treat with DCB PTA.  I discussed in depth with the patient the nature of atherosclerosis, and emphasized the importance of maximal medical management including strict control of blood pressure, blood glucose, and lipid levels, obtaining regular exercise, and cessation of smoking. The patient is aware that without maximal medical management the underlying atherosclerotic disease process will progress, limiting the benefit of any interventions.  I  emphasized the importance of routine surveillance of the patient's bypass, as the vascular surgery literature emphasize the improved patency possible with assisted primary patency procedures versus secondary patency procedures. The patient is currently not on a statin as not medically indicated. The patient is currently on an anti-platelet: Plavix.  The patient agrees to participate in their maximal medical care and routine surveillance.   Leonides SakeBrian Reiley Keisler, MD [facility] [place] Office: [phone] Pager: [phone]  12/08/2014, 11:18 AM

## 2015-05-30 ENCOUNTER — Encounter: Payer: Self-pay | Admitting: Vascular Surgery

## 2015-06-08 ENCOUNTER — Encounter: Payer: Self-pay | Admitting: Vascular Surgery

## 2015-06-08 ENCOUNTER — Ambulatory Visit (INDEPENDENT_AMBULATORY_CARE_PROVIDER_SITE_OTHER): Payer: BLUE CROSS/BLUE SHIELD | Admitting: Vascular Surgery

## 2015-06-08 ENCOUNTER — Ambulatory Visit (INDEPENDENT_AMBULATORY_CARE_PROVIDER_SITE_OTHER)
Admission: RE | Admit: 2015-06-08 | Discharge: 2015-06-08 | Disposition: A | Discharge status: Home / Self Care | Payer: BLUE CROSS/BLUE SHIELD | Source: Ambulatory Visit | Attending: Vascular Surgery | Admitting: Vascular Surgery

## 2015-06-08 ENCOUNTER — Ambulatory Visit (HOSPITAL_COMMUNITY)
Admission: RE | Admit: 2015-06-08 | Discharge: 2015-06-08 | Disposition: A | Discharge status: Home / Self Care | Payer: BLUE CROSS/BLUE SHIELD | Source: Ambulatory Visit | Attending: Vascular Surgery | Admitting: Vascular Surgery

## 2015-06-08 VITALS — BP 138/68 | HR 59 | Ht 59.0 in | Wt 153.0 lb

## 2015-06-08 DIAGNOSIS — I10 Essential (primary) hypertension: Secondary | ICD-10-CM | POA: Insufficient documentation

## 2015-06-08 DIAGNOSIS — R0989 Other specified symptoms and signs involving the circulatory and respiratory systems: Secondary | ICD-10-CM | POA: Diagnosis present

## 2015-06-08 DIAGNOSIS — X58XXXD Exposure to other specified factors, subsequent encounter: Secondary | ICD-10-CM | POA: Diagnosis not present

## 2015-06-08 DIAGNOSIS — S7012XD Contusion of left thigh, subsequent encounter: Secondary | ICD-10-CM

## 2015-06-08 NOTE — Progress Notes (Signed)
EstablishedVisit   History of Present Illness  Pamela Perez is a 65 y.o. (27-Feb-1950) female who presents for cc: surveillance studies after:  07/16/14 Large skin necrosing L hematoma s/p fall 1. Right common femoral artery cannulation with ultrasound guidance 2. Second order arterial selection 3. Left leg angiogram 4. Angioplasty and stenting Left superficial femoral artery (Viabahn 6 mm x 50 mm) 5. Washout of pre-patellar laceration 6. Simple repair of pre-patellar laceration 7. Incision and drainage of left thigh hematoma  07/20/14 Wound washout, VAC placement  Pt current asx.  All wounds healed.  No residual neuro sx in L thigh  Current Outpatient Prescriptions  Medication Sig Dispense Refill  . cephALEXin (KEFLEX) 500 MG capsule Take 1 capsule (500 mg total) by mouth 2 (two) times daily. (Patient not taking: Reported on 09/22/2014) 28 capsule 0  . clopidogrel (PLAVIX) 75 MG tablet Take 1 tablet (75 mg total) by mouth daily. 90 tablet 3  . HYDROcodone-acetaminophen (NORCO/VICODIN) 5-325 MG per tablet Take 1 tablet by mouth every 6 (six) hours as needed for moderate pain. (Patient not taking: Reported on 09/22/2014) 30 tablet 0  . lisinopril-hydrochlorothiazide (PRINZIDE,ZESTORETIC) 20-12.5 MG per tablet Take 0.5 tablets by mouth daily.    Marland Kitchen lisinopril-hydrochlorothiazide (PRINZIDE,ZESTORETIC) 20-25 MG per tablet     . metoprolol tartrate (LOPRESSOR) 25 MG tablet Take 25 mg by mouth 2 (two) times daily.    . metroNIDAZOLE (METROCREAM) 0.75 % cream Apply 1 application topically daily.    . naproxen sodium (ANAPROX) 220 MG tablet Take 220 mg by mouth 2 (two) times daily as needed (for pain.).     No current facility-administered medications for this visit.    For VQI Use Only  PRE-ADM LIVING: Home  AMB STATUS: Ambulatory  Physical Examination  Filed Vitals:   06/08/15 1127  BP: 138/68  Pulse: 59  Height:  (1.499 m)  Weight: 153 lb (69.4 kg)  SpO2: 97%     Body mass index is 30.89 kg/(m^2).  General: A&O x 3, WDWN  Pulmonary: Sym exp, good air movt, CTAB, no rales, rhonchi, & wheezing  Cardiac: RRR, Nl S1, S2, no Murmurs, rubs or gallops  Vascular: Palpable B femoral and pedal pulses  Gastrointestinal: soft, NTND, no G/R, no HSM, no masses, no CVAT B  Musculoskeletal: M/S 5/5 throughout , Extremities without ischemic changes , L thigh incision healed   Neurologic: Pain and light touch intact in extremities, Motor exam as listed above   Non-Invasive Vascular Imaging  ABI (Date: 06/08/2015)  R:   ABI: 1.07 (1.15),   DP: tri,   PT: tri,   TBI: 1.11  L:   ABI: 1.11 (1.18),   DP: tri,   PT: tri,   TBI: 0.92   LLE arterial duplex (06/08/2015)  Patent stent with mild hyperplasia  PSV 240 (2.2 ratio)   Medical Decision Making  Pamela Perez is a 65 y.o. (08-23-1950) female who presents s/p fall requiring L thigh hematoma evacuation, L SFA PTA+S to control bleeding, repair of pre-patellar laceration   Patient will require q3 month BLE ABI and LLE arterial duplex to follow the L SFA stent.   I don't usually intervene for a ratio of 2.5, rather 3.0-3.5, so will watch carefully  I discussed in depth with the patient the nature of atherosclerosis, and emphasized the importance of maximal medical management including strict control of blood pressure, blood glucose, and lipid levels, obtaining regular exercise, and cessation of smoking. The patient is aware that without  maximal medical management the underlying atherosclerotic disease process will progress, limiting the benefit of any interventions.  I emphasized the importance of routine surveillance of the patient's bypass, as the vascular surgery literature emphasize the improved patency possible with assisted primary patency procedures versus secondary patency procedures.  The patient is currently not on a statin as not medically indicated.  The patient is  currently on an anti-platelet: Plavix.  The patient agrees to participate in their maximal medical care and routine surveillance.   Leonides SakeBrian Chen, MD, FACS [facility] [place] Office: [phone] Pager: [phone]  06/08/2015, 5:15 PM

## 2015-08-31 ENCOUNTER — Encounter: Payer: Self-pay | Admitting: Vascular Surgery

## 2015-09-07 ENCOUNTER — Ambulatory Visit: Payer: BLUE CROSS/BLUE SHIELD | Admitting: Vascular Surgery

## 2015-09-07 ENCOUNTER — Ambulatory Visit (HOSPITAL_COMMUNITY): Payer: BLUE CROSS/BLUE SHIELD

## 2015-10-24 ENCOUNTER — Other Ambulatory Visit: Payer: Self-pay

## 2015-10-24 DIAGNOSIS — I739 Peripheral vascular disease, unspecified: Secondary | ICD-10-CM

## 2015-10-24 MED ORDER — CLOPIDOGREL BISULFATE 75 MG PO TABS: 75.0000 mg | ORAL_TABLET | Freq: Every day | ORAL | 3 refills | Status: DC

## 2015-11-05 ENCOUNTER — Encounter: Payer: Self-pay | Admitting: Vascular Surgery

## 2015-11-06 ENCOUNTER — Other Ambulatory Visit: Payer: Self-pay | Admitting: *Deleted

## 2015-11-06 DIAGNOSIS — I739 Peripheral vascular disease, unspecified: Secondary | ICD-10-CM

## 2015-11-08 NOTE — Progress Notes (Deleted)
EstablishedVisit   History of Present Illness  Pamela Perez is a 65 y.o. (April 24, 1950) female  who presents for cc: surveillance studies after:  07/16/14 Large skin necrosing L hematoma s/p fall 1. Right common femoral artery cannulation with ultrasound guidance 2. Second order arterial selection 3. Left leg angiogram 4. Angioplasty and stenting Left superficial femoral artery (Viabahn 6 mm x 50 mm) 5. Washout of pre-patellar laceration 6. Simple repair of pre-patellar laceration 7. Incision and drainage of left thigh hematoma  07/20/14 Wound washout, VAC placement  Pt current asx.  All wounds healed.  No residual neuro sx in L thigh  Current Outpatient Prescriptions  Medication Sig Dispense Refill  . cephALEXin (KEFLEX) 500 MG capsule Take 1 capsule (500 mg total) by mouth 2 (two) times daily. (Patient not taking: Reported on 09/22/2014) 28 capsule 0  . clopidogrel (PLAVIX) 75 MG tablet Take 1 tablet (75 mg total) by mouth daily. 90 tablet 3  . HYDROcodone-acetaminophen (NORCO/VICODIN) 5-325 MG per tablet Take 1 tablet by mouth every 6 (six) hours as needed for moderate pain. (Patient not taking: Reported on 09/22/2014) 30 tablet 0  . lisinopril-hydrochlorothiazide (PRINZIDE,ZESTORETIC) 20-12.5 MG per tablet Take 0.5 tablets by mouth daily.    Marland Kitchen lisinopril-hydrochlorothiazide (PRINZIDE,ZESTORETIC) 20-25 MG per tablet Reported on 06/08/2015    . metoprolol tartrate (LOPRESSOR) 25 MG tablet Take 25 mg by mouth 2 (two) times daily.    . metroNIDAZOLE (METROCREAM) 0.75 % cream Apply 1 application topically daily.    . naproxen sodium (ANAPROX) 220 MG tablet Take 220 mg by mouth 2 (two) times daily as needed (for pain.). Reported on 06/08/2015     No current facility-administered medications for this visit.    For VQI Use Only  PRE-ADM LIVING: Home  AMB STATUS: Ambulatory   Physical Examination  There were no vitals filed for this visit.  There is no height or weight  on file to calculate BMI.  General: A&O x 3, WDWN  Pulmonary: Sym exp, good air movt, CTAB, no rales, rhonchi, & wheezing  Cardiac: RRR, Nl S1, S2, no Murmurs, rubs or gallops  Vascular: Palpable B femoral and pedal pulses  Gastrointestinal: soft, NTND, no G/R, no HSM, no masses, no CVAT B  Musculoskeletal: M/S 5/5 throughout , Extremities without ischemic changes , L thigh incision healed   Neurologic: Pain and light touch intact in extremities, Motor exam as listed above   Non-Invasive Vascular Imaging  ABI (Date: 11/08/2015)  R:   ABI: *** (***),   DP: {Signals:19197::"none","mono","bi","tri"}  PT: {Signals:19197::"none","mono","bi","tri"}  TBI:  ***  L:   ABI: *** (***),   DP: {Signals:19197::"none","mono","bi","tri"}  PT: {Signals:19197::"none","mono","bi","tri"}  TBI: ***   LLE arterial duplex (***)  ***Patent stent with mild hyperplasia  PSV 240 (2.2 ratio)   Medical Decision Making  Pamela Perez is a 65 y.o. (06-29-1950) female  who presents s/p fall requiring L thigh hematoma evacuation, L SFA PTA+S to control bleeding, repair of pre-patellar laceration   ***  I discussed in depth with the patient the nature of atherosclerosis, and emphasized the importance of maximal medical management including strict control of blood pressure, blood glucose, and lipid levels, obtaining regular exercise, and cessation of smoking. The patient is aware that without maximal medical management the underlying atherosclerotic disease process will progress, limiting the benefit of any interventions.  I emphasized the importance of routine surveillance of the patient's bypass, as the vascular surgery literature emphasize the improved patency possible with assisted primary patency procedures versus  secondary patency procedures.  The patient is currently not on a statin as not medically indicated.  The patient is currently on an anti-platelet:  Plavix.  The patient agrees to participate in their maximal medical care and routine surveillance.   [name], MD, FACS [facility] [place] Office: [phone] Pager: [phone]

## 2015-11-09 ENCOUNTER — Encounter: Payer: Self-pay | Admitting: Vascular Surgery

## 2015-11-09 ENCOUNTER — Ambulatory Visit (HOSPITAL_COMMUNITY)
Admission: RE | Admit: 2015-11-09 | Discharge: 2015-11-09 | Disposition: A | Discharge status: Home / Self Care | Payer: BLUE CROSS/BLUE SHIELD | Source: Ambulatory Visit | Attending: Vascular Surgery | Admitting: Vascular Surgery

## 2015-11-09 ENCOUNTER — Ambulatory Visit (INDEPENDENT_AMBULATORY_CARE_PROVIDER_SITE_OTHER): Payer: BLUE CROSS/BLUE SHIELD | Admitting: Vascular Surgery

## 2015-11-09 ENCOUNTER — Ambulatory Visit (INDEPENDENT_AMBULATORY_CARE_PROVIDER_SITE_OTHER)
Admission: RE | Admit: 2015-11-09 | Discharge: 2015-11-09 | Disposition: A | Discharge status: Home / Self Care | Payer: BLUE CROSS/BLUE SHIELD | Source: Ambulatory Visit | Attending: Vascular Surgery | Admitting: Vascular Surgery

## 2015-11-09 ENCOUNTER — Other Ambulatory Visit: Payer: Self-pay

## 2015-11-09 ENCOUNTER — Other Ambulatory Visit: Payer: Self-pay | Admitting: Vascular Surgery

## 2015-11-09 VITALS — BP 122/69 | HR 59 | Temp 97.5°F | Resp 14 | Ht 59.0 in | Wt 150.7 lb

## 2015-11-09 DIAGNOSIS — T82856A Stenosis of peripheral vascular stent, initial encounter: Secondary | ICD-10-CM

## 2015-11-09 DIAGNOSIS — I739 Peripheral vascular disease, unspecified: Secondary | ICD-10-CM | POA: Insufficient documentation

## 2015-11-09 DIAGNOSIS — I743 Embolism and thrombosis of arteries of the lower extremities: Secondary | ICD-10-CM | POA: Diagnosis not present

## 2015-11-09 DIAGNOSIS — S7012XD Contusion of left thigh, subsequent encounter: Secondary | ICD-10-CM

## 2015-11-09 DIAGNOSIS — Z95820 Peripheral vascular angioplasty status with implants and grafts: Secondary | ICD-10-CM | POA: Insufficient documentation

## 2015-11-09 NOTE — Progress Notes (Signed)
HISTORY AND PHYSICAL     CC:  Follow up Referring Provider:  No ref. provider found  HPI: This is a 65 y.o. female who is s/p: 07/16/14:  Large skin necrosing L hematoma s/p fall 1. Right common femoral artery cannulation with ultrasound guidance 2. Second order arterial selection 3. Left leg angiogram 4. Angioplasty and stenting Left superficial femoral artery (Viabahn 6 mm x 50 mm) 5. Washout of pre-patellar laceration 6. Simple repair of pre-patellar laceration 7. Incision and drainage of left thigh hematoma  07/20/14:   Wound washout, VAC placement  She presents today for arterial duplex to evaluate LLE.  She states she does have some cramping in her left calf when she walks.  She denies any non healing wounds.  She has not had any change in her health since she was last here.    She is on Plavix for her stent.  She takes a beta blocker and ACEI for blood pressure management.     Past Medical History:  Diagnosis Date  . Hypertension     Past Surgical History:  Procedure Laterality Date  . APPLICATION OF WOUND VAC Left 07/20/2014   Procedure: APPLICATION OF WOUND VAC;  Surgeon: Fransisco Hertz, MD;  Location: [facility] OR;  Service: Vascular;  Laterality: Left;  . CHOLECYSTECTOMY    . I&D EXTREMITY Left 07/20/2014   Procedure: IRRIGATION AND DEBRIDEMENT EXTREMITY;  Surgeon: Fransisco Hertz, MD;  Location: [facility] OR;  Service: Vascular;  Laterality: Left;  . INSERTION OF ILIAC STENT Left 07/16/2014   Procedure: Left Leg Angiogram, Stenting of Left Superficial Femoral Artery, Repair of Pre-Patella Laceration, Evacuation of Left Thigh Hematoma;  Surgeon: Fransisco Hertz, MD;  Location: [facility] OR;  Service: Vascular;  Laterality: Left;    No Known Allergies  Current Outpatient Prescriptions  Medication Sig Dispense Refill  . clopidogrel (PLAVIX) 75 MG tablet Take 1 tablet (75 mg total) by mouth daily. 90 tablet 3  . lisinopril-hydrochlorothiazide (PRINZIDE,ZESTORETIC) 20-12.5 MG per tablet Take 0.5  tablets by mouth daily.    Marland Kitchen lisinopril-hydrochlorothiazide (PRINZIDE,ZESTORETIC) 20-25 MG per tablet Reported on 06/08/2015    . metoprolol tartrate (LOPRESSOR) 25 MG tablet Take 25 mg by mouth 2 (two) times daily.    . metroNIDAZOLE (METROCREAM) 0.75 % cream Apply 1 application topically daily.    . cephALEXin (KEFLEX) 500 MG capsule Take 1 capsule (500 mg total) by mouth 2 (two) times daily. (Patient not taking: Reported on 11/09/2015) 28 capsule 0  . HYDROcodone-acetaminophen (NORCO/VICODIN) 5-325 MG per tablet Take 1 tablet by mouth every 6 (six) hours as needed for moderate pain. (Patient not taking: Reported on 11/09/2015) 30 tablet 0  . naproxen sodium (ANAPROX) 220 MG tablet Take 220 mg by mouth 2 (two) times daily as needed (for pain.). Reported on 06/08/2015     No current facility-administered medications for this visit.     Family History  Problem Relation Age of Onset  . Arthritis Mother     Gout  . Kidney failure Mother   . Heart disease Father     Before age 69  . Hyperlipidemia Father   . Heart attack Father     Social History   Social History  . Marital status: Married    Spouse name: N/A  . Number of children: N/A  . Years of education: N/A   Occupational History  . Not on file.   Social History Main Topics  . Smoking status: Never Smoker  . Smokeless tobacco: Never Used  .  Alcohol use No  . Drug use: No  . Sexual activity: Not on file   Other Topics Concern  . Not on file   Social History Narrative  . No narrative on file     REVIEW OF SYSTEMS:   [X]  denotes positive finding, [ ]  denotes negative finding Cardiac  Comments:  Chest pain or chest pressure:    Shortness of breath upon exertion:    Short of breath when lying flat:    Irregular heart rhythm:        Vascular    Pain in calf, thigh, or hip brought on by ambulation: x   Pain in feet at night that wakes you up from your sleep:     Blood clot in your veins:    Leg swelling:           Pulmonary    Oxygen at home:    Productive cough:     Wheezing:         Neurologic    Sudden weakness in arms or legs:     Sudden numbness in arms or legs:     Sudden onset of difficulty speaking or slurred speech:    Temporary loss of vision in one eye:     Problems with dizziness:         Gastrointestinal    Blood in stool:     Vomited blood:         Genitourinary    Burning when urinating:     Blood in urine:        Psychiatric    Major depression:         Hematologic    Bleeding problems:    Problems with blood clotting too easily:        Skin    Rashes or ulcers:        Constitutional    Fever or chills:      PHYSICAL EXAMINATION:  Vitals:   11/09/15 1453  BP: 122/69  Pulse: (!) 59  Resp: 14  Temp: 97.5 F (36.4 C)   Body mass index is 30.44 kg/m.  General:  WDWN in NAD; vital signs documented above Gait: Normal HENT: WNL, normocephalic Pulmonary: normal non-labored breathing , without wheezing Cardiac: regular   Skin: without rashes Vascular Exam/Pulses:  Right Left  DP 2+ (normal) Unable to palpate   PT 2+ (normal) trace   Extremities: without ischemic changes, without Gangrene , without cellulitis; without open wounds; well healed incision left medial thigh Musculoskeletal: no muscle wasting or atrophy  Neurologic: A&O X 3;  No focal weakness or paresthesias are detected Psychiatric:  The pt has Normal affect.   Non-Invasive Vascular Imaging:   Lower extremity arterial evaluation 11/09/15: --Patent left SFA stent with hyperplasia noted in the proximal segment with velocities suggesting a 50-99% stenosis **Significant decline in the left ABI with disease progression of the left SFA stent compared to exam on 06/08/15.  ABI's 11/09/15: Right:  0.63 Left:  1.01  Previous ABI's 06/08/15: Right:  1.11 Left:  1.07  Pt meds includes: Statin:  Yes.   Beta Blocker:  Yes.   Aspirin:  No. ACEI:  Yes.   ARB:  No. Other  Antiplatelet/Anticoagulant:  Yes.  Plavix   ASSESSMENT/PLAN:: 65 y.o. female Angioplasty and stenting Left superficial femoral artery (Viabahn 6 mm x 50 mm) 07/16/14   -pt's ABI's are significantly decreased today from 06/08/15 with velocity greater than 500cm/s in the left SFA stent and she is  having claudication of the left leg.   -Dr. Imogene Burn discussed with pt the need for arteriogram with angioplasty with drug coated balloon.  Would like to proceed sooner rather than later and will schedule next Wednesday, 11/14/15 by Dr. Randie Heinz. -she is to continue her Plavix.     Doreatha Massed, PA-C [facility] [phone]  Clinic MD:  Pt seen and examined in conjunction with Dr. Imogene Burn  Addendum  I have independently interviewed and examined the patient, and I agree with the physician assistant's findings.  Pt has likely in-stent restenosis of her Viabahn stent in the L SFA.  I have scheduled the patient with Dr. Randie Heinz on Wed 27 SEP 17 to angioplasty the L SFA stent to salvage the stent.   I discussed with the patient the nature of angiographic procedures, especially the limited patencies of any endovascular intervention.    The patient is aware of that the risks of an angiographic procedure include but are not limited to: bleeding, infection, access site complications, renal failure, embolization, rupture of vessel, dissection, arteriovenous fistula, possible need for emergent surgical intervention, possible need for surgical procedures to treat the patient's pathology, anaphylactic reaction to contrast, and stroke and death.    The patient is aware of the risks and agrees to proceed.   Leonides Sake, MD [facility] [place] Office: [phone] Pager: [phone]  11/09/2015, 4:00 PM

## 2015-11-14 ENCOUNTER — Encounter (HOSPITAL_COMMUNITY)
Admission: RE | Disposition: A | Discharge status: Home / Self Care | Payer: Self-pay | Source: Ambulatory Visit | Attending: Vascular Surgery

## 2015-11-14 ENCOUNTER — Ambulatory Visit (HOSPITAL_COMMUNITY)
Admission: RE | Admit: 2015-11-14 | Discharge: 2015-11-14 | Disposition: A | Discharge status: Home / Self Care | Payer: BLUE CROSS/BLUE SHIELD | Source: Ambulatory Visit | Attending: Vascular Surgery | Admitting: Vascular Surgery

## 2015-11-14 DIAGNOSIS — I97638 Postprocedural hematoma of a circulatory system organ or structure following other circulatory system procedure: Secondary | ICD-10-CM | POA: Insufficient documentation

## 2015-11-14 DIAGNOSIS — Y838 Other surgical procedures as the cause of abnormal reaction of the patient, or of later complication, without mention of misadventure at the time of the procedure: Secondary | ICD-10-CM | POA: Insufficient documentation

## 2015-11-14 DIAGNOSIS — T82856A Stenosis of peripheral vascular stent, initial encounter: Secondary | ICD-10-CM

## 2015-11-14 DIAGNOSIS — I70222 Atherosclerosis of native arteries of extremities with rest pain, left leg: Secondary | ICD-10-CM | POA: Diagnosis not present

## 2015-11-14 DIAGNOSIS — Y812 Prosthetic and other implants, materials and accessory general- and plastic-surgery devices associated with adverse incidents: Secondary | ICD-10-CM | POA: Diagnosis not present

## 2015-11-14 HISTORY — PX: PERIPHERAL VASCULAR CATHETERIZATION: SHX172C

## 2015-11-14 LAB — POCT I-STAT, CHEM 8
BUN: 17 mg/dL (ref 6–20)
CALCIUM ION: 1.18 mmol/L (ref 1.15–1.40)
Chloride: 102 mmol/L (ref 101–111)
Creatinine, Ser: 0.7 mg/dL (ref 0.44–1.00)
Glucose, Bld: 121 mg/dL — ABNORMAL HIGH (ref 65–99)
HCT: 44 % (ref 36.0–46.0)
Hemoglobin: 15 g/dL (ref 12.0–15.0)
Potassium: 4 mmol/L (ref 3.5–5.1)
SODIUM: 140 mmol/L (ref 135–145)
TCO2: 29 mmol/L (ref 0–100)

## 2015-11-14 LAB — POCT ACTIVATED CLOTTING TIME
ACTIVATED CLOTTING TIME: 307 s
ACTIVATED CLOTTING TIME: 202 s
ACTIVATED CLOTTING TIME: 186 s

## 2015-11-14 SURGERY — LOWER EXTREMITY ANGIOGRAPHY
Anesthesia: LOCAL | Laterality: Left

## 2015-11-14 MED ORDER — MIDAZOLAM HCL 2 MG/2ML IJ SOLN
INTRAMUSCULAR | Status: AC
  Filled 2015-11-14: qty 2

## 2015-11-14 MED ORDER — OXYCODONE-ACETAMINOPHEN 5-325 MG PO TABS
1.0000 | ORAL_TABLET | ORAL | Status: DC | PRN
  Administered 2015-11-14: 1 via ORAL

## 2015-11-14 MED ORDER — OXYCODONE-ACETAMINOPHEN 5-325 MG PO TABS
ORAL_TABLET | ORAL | Status: AC
  Administered 2015-11-14: 1 via ORAL
  Filled 2015-11-14: qty 1

## 2015-11-14 MED ORDER — LIDOCAINE HCL (PF) 1 % IJ SOLN
INTRAMUSCULAR | Status: DC | PRN
  Administered 2015-11-14: 11 mL
  Administered 2015-11-14: 7000 mL

## 2015-11-14 MED ORDER — FENTANYL CITRATE (PF) 100 MCG/2ML IJ SOLN
INTRAMUSCULAR | Status: AC
  Filled 2015-11-14: qty 2

## 2015-11-14 MED ORDER — HEPARIN (PORCINE) IN NACL 2-0.9 UNIT/ML-% IJ SOLN
INTRAMUSCULAR | Status: DC | PRN
  Administered 2015-11-14: 1000 mL via INTRA_ARTERIAL

## 2015-11-14 MED ORDER — LIDOCAINE HCL (PF) 1 % IJ SOLN
INTRAMUSCULAR | Status: AC
  Filled 2015-11-14: qty 30

## 2015-11-14 MED ORDER — MIDAZOLAM HCL 2 MG/2ML IJ SOLN
INTRAMUSCULAR | Status: DC | PRN
  Administered 2015-11-14: 1 mg via INTRAVENOUS
  Administered 2015-11-14: 0.5 mg via INTRAVENOUS

## 2015-11-14 MED ORDER — SODIUM CHLORIDE 0.9 % IV SOLN: 1.0000 mL/kg/h | INTRAVENOUS | Status: AC

## 2015-11-14 MED ORDER — FENTANYL CITRATE (PF) 100 MCG/2ML IJ SOLN
INTRAMUSCULAR | Status: DC | PRN
  Administered 2015-11-14: 50 ug via INTRAVENOUS
  Administered 2015-11-14: 25 ug via INTRAVENOUS

## 2015-11-14 MED ORDER — IODIXANOL 320 MG/ML IV SOLN
INTRAVENOUS | Status: DC | PRN
  Administered 2015-11-14: 115 mL via INTRA_ARTERIAL

## 2015-11-14 MED ORDER — MORPHINE SULFATE (PF) 2 MG/ML IV SOLN: 2.0000 mg | INTRAVENOUS | Status: DC | PRN

## 2015-11-14 MED ORDER — SODIUM CHLORIDE 0.9 % IV SOLN: INTRAVENOUS | Status: DC

## 2015-11-14 SURGICAL SUPPLY — 20 items
BALLN IN.PACT DCB 5X60 (BALLOONS) ×2
BUR JETSTREAM XC 2.1/3.0 (BURR) ×1 IMPLANT
BURR JETSTREAM XC 2.1/3.0 (BURR) ×2
CATH OMNI FLUSH 5F 65CM (CATHETERS) ×2 IMPLANT
CATH QUICKCROSS SUPP .018X90CM (MICROCATHETER) ×2 IMPLANT
COVER PRB 48X5XTLSCP FOLD TPE (BAG) ×1 IMPLANT
COVER PROBE 5X48 (BAG) ×1
DCB IN.PACT 5X60 (BALLOONS) ×1 IMPLANT
DEVICE EMBOSHIELD NAV6 4.0-7.0 (WIRE) ×2 IMPLANT
KIT ENCORE 26 ADVANTAGE (KITS) ×2 IMPLANT
KIT MICROINTRODUCER STIFF 5F (SHEATH) ×2 IMPLANT
KIT PV (KITS) ×2 IMPLANT
SHEATH HIGHFLEX ANSEL 7FR 55CM (SHEATH) ×2 IMPLANT
SHEATH PINNACLE 5F 10CM (SHEATH) ×2 IMPLANT
SYR MEDRAD MARK V 150ML (SYRINGE) ×2 IMPLANT
TRANSDUCER W/STOPCOCK (MISCELLANEOUS) ×2 IMPLANT
TRAY PV CATH (CUSTOM PROCEDURE TRAY) ×2 IMPLANT
WIRE BAREWIRE WORK .014X315CM (WIRE) ×2 IMPLANT
WIRE BENTSON .035X145CM (WIRE) ×2 IMPLANT
WIRE SPARTACORE .014X300CM (WIRE) ×2 IMPLANT

## 2015-11-14 NOTE — Discharge Instructions (Signed)
Angiogram, Care After °Refer to this sheet in the next few weeks. These instructions provide you with information about caring for yourself after your procedure. Your health care provider may also give you more specific instructions. Your treatment has been planned according to current medical practices, but problems sometimes occur. Call your health care provider if you have any problems or questions after your procedure. °WHAT TO EXPECT AFTER THE PROCEDURE °After your procedure, it is typical to have the following: °· Bruising at the catheter insertion site that usually fades within 1-2 weeks. °· Blood collecting in the tissue (hematoma) that may be painful to the touch. It should usually decrease in size and tenderness within 1-2 weeks. °HOME CARE INSTRUCTIONS °· Take medicines only as directed by your health care provider. °· You may shower 24-48 hours after the procedure or as directed by your health care provider. Remove the bandage (dressing) and gently wash the site with plain soap and water. Pat the area dry with a clean towel. Do not rub the site, because this may cause bleeding. °· Do not take baths, swim, or use a hot tub until your health care provider approves. °· Check your insertion site every day for redness, swelling, or drainage. °· Do not apply powder or lotion to the site. °· Do not lift over 10 lb (4.5 kg) for 5 days after your procedure or as directed by your health care provider. °· Ask your health care provider when it is okay to: °¨ Return to work or school. °¨ Resume usual physical activities or sports. °¨ Resume sexual activity. °· Do not drive home if you are discharged the same day as the procedure. Have someone else drive you. °· You may drive 24 hours after the procedure unless otherwise instructed by your health care provider. °· Do not operate machinery or power tools for 24 hours after the procedure or as directed by your health care provider. °· If your procedure was done as an  outpatient procedure, which means that you went home the same day as your procedure, a responsible adult should be with you for the first 24 hours after you arrive home. °· Keep all follow-up visits as directed by your health care provider. This is important. °SEEK MEDICAL CARE IF: °· You have a fever. °· You have chills. °· You have increased bleeding from the catheter insertion site. Hold pressure on the site.  CALL 911 °SEEK IMMEDIATE MEDICAL CARE IF: °· You have unusual pain at the catheter insertion site. °· You have redness, warmth, or swelling at the catheter insertion site. °· You have drainage (other than a small amount of blood on the dressing) from the catheter insertion site. °· The catheter insertion site is bleeding, and the bleeding does not stop after 30 minutes of holding steady pressure on the site. °· The area near or just beyond the catheter insertion site becomes pale, cool, tingly, or numb. °  °This information is not intended to replace advice given to you by your health care provider. Make sure you discuss any questions you have with your health care provider. °  °Document Released: 08/22/2004 Document Revised: 02/24/2014 Document Reviewed: 07/07/2012 °Elsevier Interactive Patient Education ©2016 Elsevier Inc. ° °

## 2015-11-14 NOTE — Progress Notes (Addendum)
Site area: RFA Site Prior to Removal:  Level 0 Pressure Applied For:6830min Manual: yes   Patient Status During Pull:  stable Post Pull Site:  Level 0 Post Pull Instructions Given: yes  Post Pull Pulses Present: palpable Dressing Applied: tegaderm  Bedrest begins @ 1730  Till 2130 Comments:

## 2015-11-14 NOTE — H&P (Signed)
     History and Physical Update  The patient was interviewed and re-examined.  The patient's previous History and Physical has been reviewed and is unchanged from Dr. Imogene Burnhen office visit. Will plan for angiogram with intervention of left sfa stent.   Brandon C. Randie Heinzain, MD [facility] [place] Office: [phone] Pager: [phone]   11/14/2015, 1:26 PM

## 2015-11-14 NOTE — Progress Notes (Addendum)
Right groin hematoma, pressure being held by chip [employer] Partnersanderson rn . Dr Randie Heinzain was informed. Dr Early came and assessed site, he will discuss with Dr Randie Heinzain.

## 2015-11-14 NOTE — Op Note (Signed)
    Patient name: Pamela LovettRebecca Band MRN: [id number] DOB: 11/03/1950 Sex: female  11/14/2015 Pre-operative Diagnosis: in stent stenosis of covered stent Post-operative diagnosis:  Same Surgeon:  Luanna SalkBrandon C. Randie Heinzain, MD Procedure Performed: 1.  US guided access Right common femoral artery 2.  jetstream atherectomy of left sfa stent  3.  Balloon angioplasty left sfa with 5mm drug-coated balloon  Indications:  65 year old white female with history of left SFA stent for trauma. She now has left lower extremity rest pain claudication and duplex demonstrating stenosis of the left SFA stent.  Findings: There was a stenosis at the proximal aspect of the stent extending into the native vessel. There is also less degree stenosis throughout the stent. Following jetstream atherectomy drug-coated balloon there was brisk runoff through the stent three-vessel runoff to the level of the ankle.   Procedure:  The patient was identified in the holding area and taken to room 8.  The patient was then placed supine on the table and prepped and draped in the usual sterile fashion.  A time out was called.  Ultrasound was used to evaluate the right common femoral artery.  It was patent .  A digital ultrasound image was acquired.  A micropuncture needle was used to access the right common femoral artery under ultrasound guidance.  An 018 wire was advanced without resistance and a micropuncture sheath was placed.  The 018 wire was removed and a benson wire was placed.  The micropuncture sheath was exchanged for a 5 french sheath.  An omniflush catheter was advanced over the wire to the level of L-1.  An abdominal angiogram was obtained.  Next, using the omniflush catheter and a benson wire, the aortic bifurcation was crossed and the catheter was placed into theleft external iliac artery and left runoff was obtained.  Patient was then heparinized and a 7 French sheath was placed into the left SFA. We crossed the stent stenosis with 014  wire. We then deployed a nav6 direction device in the proximal popliteal artery. Jetstream was then identified and brought to the field and atherectomy performed throughout the stent as well as proximally and distally in the native artery. Angiogram demonstrated improved flow through the stent. 60 mm drug coated balloon was then placed. Completion angiogram demonstrated the above findings. Satisfied the sheath was pulled back into the right iliofemoral system catheters and wires removed. Patient retransferred to postoperative holding area with sheath will be removed.  Contrast: 130cc  Sedation Time: 40min   Lige Lakeman C. Randie Heinzain, MD [facility] [place] Office: [phone] Pager: [phone]

## 2015-11-14 NOTE — Progress Notes (Signed)
On arrival to short stay palpation revealed suspicious area above insertion site. Pressure held x 20 min .Cristy Friedlanderodney Cox and Dr Early in to evaluate. Will continue to observe in short stay and Dr Randie Heinzain will evaluate later this evening.

## 2015-11-15 ENCOUNTER — Encounter (HOSPITAL_COMMUNITY): Payer: Self-pay | Admitting: Vascular Surgery

## 2015-11-15 ENCOUNTER — Telehealth: Payer: Self-pay | Admitting: Vascular Surgery

## 2015-11-15 ENCOUNTER — Other Ambulatory Visit: Payer: Self-pay | Admitting: *Deleted

## 2015-11-15 DIAGNOSIS — Z9862 Peripheral vascular angioplasty status: Secondary | ICD-10-CM

## 2015-11-15 DIAGNOSIS — I739 Peripheral vascular disease, unspecified: Secondary | ICD-10-CM

## 2015-11-15 NOTE — Telephone Encounter (Signed)
Spoke to pt can not come in at a 1 mo so she opted for US on 11/16 and f/u on 11/17

## 2015-11-15 NOTE — Telephone Encounter (Signed)
-----   Message from Sharee PimpleMarilyn K McChesney, RN sent at 11/15/2015  9:56 AM EDT ----- Regarding: month w/ labs    ----- Message ----- From: Maeola HarmanBrandon Christopher Cain, MD Sent: 11/14/2015   3:32 PM To: [facility] [address]  Delfino LovettRebecca Perez [id number] 10/07/1950  11/14/2015 Pre-operative Diagnosis: in stent stenosis of covered stent  Surgeon:  Luanna SalkBrandon C. Randie Heinzain, MD  Procedure Performed: 1.  US guided access Right common femoral artery 2.  jetstream atherectomy of left sfa stent  3.  Balloon angioplasty left sfa with 5mm drug-coated balloon  F/u in 1 month with Dr. Imogene Burnhen with repeat LLE duplex and ABI
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
## 2015-12-20 ENCOUNTER — Encounter (HOSPITAL_COMMUNITY): Payer: BLUE CROSS/BLUE SHIELD

## 2015-12-21 ENCOUNTER — Ambulatory Visit: Payer: BLUE CROSS/BLUE SHIELD | Admitting: Vascular Surgery

## 2016-01-02 ENCOUNTER — Encounter: Payer: Self-pay | Admitting: Vascular Surgery

## 2016-01-03 ENCOUNTER — Ambulatory Visit (HOSPITAL_COMMUNITY)
Admission: RE | Admit: 2016-01-03 | Discharge: 2016-01-03 | Disposition: A | Discharge status: Home / Self Care | Payer: BLUE CROSS/BLUE SHIELD | Source: Ambulatory Visit | Attending: Vascular Surgery | Admitting: Vascular Surgery

## 2016-01-03 DIAGNOSIS — I739 Peripheral vascular disease, unspecified: Secondary | ICD-10-CM | POA: Insufficient documentation

## 2016-01-03 DIAGNOSIS — Z9862 Peripheral vascular angioplasty status: Secondary | ICD-10-CM | POA: Diagnosis not present

## 2016-01-03 LAB — VAS US LOWER EXTREMITY ARTERIAL DUPLEX
LSFPPSV: -143 cm/s
Left super femoral dist sys PSV: -138 cm/s
Left super femoral mid sys PSV: -131 cm/s

## 2016-01-03 NOTE — Progress Notes (Signed)
    Postoperative Visit   History of Present Illness  Pamela LovettRebecca Perez is a 65 y.o. female who presents for postoperative follow-up from procedure on Date: 11/14/15: athrectomy and angioplasty L SFA stent .  The patient notes mostly resolved lower extremity symptoms.  The patient is able to complete their activities of daily living.  The patient's current symptoms are: mild aching prior hematoma evacuation cavity.  Past Medical History, Past Surgical History, Social History, Family History, Medications, Allergies, and Review of Systems are unchanged from previous evaluation on 11/14/15.  Current Outpatient Prescriptions  Medication Sig Dispense Refill  . clopidogrel (PLAVIX) 75 MG tablet Take 1 tablet (75 mg total) by mouth daily. 90 tablet 3  . GARLIC PO Take 1 tablet by mouth every other day.    . lisinopril-hydrochlorothiazide (PRINZIDE,ZESTORETIC) 20-12.5 MG per tablet Take 0.5 tablets by mouth daily.    . metoprolol succinate (TOPROL-XL) 100 MG 24 hr tablet Take 100 mg by mouth daily.    . metroNIDAZOLE (METROCREAM) 0.75 % cream Apply 1 application topically daily.     No current facility-administered medications for this visit.     No Known Allergies   For VQI Use Only  PRE-ADM LIVING: Home  AMB STATUS: Ambulatory   Physical Examination  Vitals:   01/04/16 0923 01/04/16 0927 01/04/16 0928  BP: (!) 194/84 (!) 190/84 (!) 204/98  Pulse: 70 74 74  Resp:   18  Temp:   97.8 F (36.6 C)  TempSrc:   Oral  SpO2:   95%  Weight:   149 lb (67.6 kg)  Height:   4\' 11"  (1.499 m)    Body mass index is 30.09 kg/m.  General: Alert, O x 3, WD,NAD  Pulmonary: Sym exp, good B air movt,  Cardiac: RRR, Nl S1, S2,   Vascular: Vessel Right Left  Femoral Palpable Palpable  Popliteal Not palpable Not palpable  PT Palpable Palpable  DP Palpable Palpable   Musculoskeletal: M/S 5/5 throughout  , Extremities without ischemic changes  , No edema present,   Neurologic: Pain and  light touch intact in extremities, Motor exam as listed above   Medical Decision Making  Pamela LovettRebecca Snuffer is a 65 y.o. female who presents s/p L SFA PTA+S for massive L thigh hematoma related to traumatic fall, s/p recent atherectomy and PTA L SFA for in-stent restenosis.  Based on his angiographic findings, this patient needs: q3 month duplex and ABI. I discussed in depth with the patient the nature of atherosclerosis, and emphasized the importance of maximal medical management including strict control of blood pressure, blood glucose, and lipid levels, obtaining regular exercise, and cessation of smoking.  The patient is aware that without maximal medical management the underlying atherosclerotic disease process will progress, limiting the benefit of any interventions. The patient is currently not on a statin as not medically indicated per patient. The patient is currently on an anti-platelet: Plavix.  Thank you for allowing us to participate in this patient's care.   Leonides SakeBrian Joselito Fieldhouse, MD, FACS [facility] [place] Office: [phone] Pager: [phone]

## 2016-01-04 ENCOUNTER — Ambulatory Visit (INDEPENDENT_AMBULATORY_CARE_PROVIDER_SITE_OTHER): Payer: BLUE CROSS/BLUE SHIELD | Admitting: Vascular Surgery

## 2016-01-04 ENCOUNTER — Other Ambulatory Visit: Payer: Self-pay | Admitting: *Deleted

## 2016-01-04 ENCOUNTER — Encounter: Payer: Self-pay | Admitting: Vascular Surgery

## 2016-01-04 VITALS — BP 204/98 | HR 74 | Temp 97.8°F | Resp 18 | Ht 59.0 in | Wt 149.0 lb

## 2016-01-04 DIAGNOSIS — S7012XD Contusion of left thigh, subsequent encounter: Secondary | ICD-10-CM | POA: Diagnosis not present

## 2016-01-04 DIAGNOSIS — I739 Peripheral vascular disease, unspecified: Secondary | ICD-10-CM

## 2016-04-08 ENCOUNTER — Encounter: Payer: Self-pay | Admitting: Vascular Surgery

## 2016-04-08 NOTE — Progress Notes (Signed)
Established Endovascular Interventions   History of Present Illness  Pamela Perez is a 66 y.o. (1951/01/14) female who presents with chief complaint: none.  Previous operation(s) include:   1.  PTA L SFA for in-stent restenosis., Jetstream atherectomy L SFA stent (11/14/15) 2.  Washout of L thigh wound, VAC placement (07/20/14) 3. L SFA PTA+S for massive L thigh hematoma related to traumatic fall  (07/16/14)   The patient's symptoms have improved.  The patient's symptoms are: none. The patient's treatment regimen currently included: maximal medical management.  The patient's PMH, PSH, SH, and FamHx are unchanged from 01/04/16.  Current Outpatient Prescriptions  Medication Sig Dispense Refill  . clopidogrel (PLAVIX) 75 MG tablet Take 1 tablet (75 mg total) by mouth daily. 90 tablet 3  . GARLIC PO Take 1 tablet by mouth every other day.    . lisinopril-hydrochlorothiazide (PRINZIDE,ZESTORETIC) 20-12.5 MG per tablet Take 0.5 tablets by mouth daily.    . metoprolol succinate (TOPROL-XL) 100 MG 24 hr tablet Take 100 mg by mouth daily.    . metroNIDAZOLE (METROCREAM) 0.75 % cream Apply 1 application topically daily.     No current facility-administered medications for this visit.     No Known Allergies  On ROS today: no intermittent claudication , no rest pain   Physical Examination  Vitals:   04/11/16 1007 04/11/16 1010  BP: (!) 174/80 (!) 178/83  Pulse: 63 (!) 56  Resp: 14   Temp: 98.1 F (36.7 C)   SpO2: 99%   Weight: 148 lb (67.1 kg)   Height: 4\' 11"  (1.499 m)     Body mass index is 29.89 kg/m.  General: Alert, O x 3, WD,NAD  Pulmonary: Sym exp, good B air movt,CTA B  Cardiac: RRR, Nl S1, S2, no Murmurs, No rubs, No S3,S4  Vascular: Vessel Right Left  Radial Palpable Palpable  Brachial Palpable Palpable  Carotid Palpable, No Bruit Palpable, No Bruit  Aorta Not palpable N/A  Femoral Palpable Palpable  Popliteal Not palpable Not palpable  PT Palpable  Palpable  DP Palpable Palpable   Gastrointestinal: soft, non-distended, non-tender to palpation, No guarding or rebound, no HSM, no masses, no CVAT B, No palpable prominent aortic pulse,    Musculoskeletal: M/S 5/5 throughout  , Extremities without ischemic changes  , No edema present, No LDS present  Neurologic: CN 2-12 intact , Pain and light touch intact in extremities , Motor exam as listed above   Non-Invasive Vascular Imaging ABI (Date: 04/08/2016)  R:   ABI: 1.19  DP: tri  PT: tri  TBI:  0.95  L:   ABI: 1.19,   DP: tri  PT: tri  TBI: 0.91  LLE Duplex (Date: 04/08/2016)  Biphasic throughout  Patent stent without evidence of restenosis: 116-138 c/s   Medical Decision Making  Pamela Perez is a 66 y.o. female who presents with: s/p L SFA PTA+S for massive L thigh hematoma related to traumatic fall, s/p recent atherectomy and PTA L SFA for in-stent restenosis.   Based on the patient's vascular studies and examination, I have offered the patient: BLE ABI, bypass duplex, and aortoiliac duplex in 3 months.  I discussed in depth with the patient the nature of atherosclerosis, and emphasized the importance of maximal medical management including strict control of blood pressure, blood glucose, and lipid levels, obtaining regular exercise, and cessation of smoking.    The patient is aware that without maximal medical management the underlying atherosclerotic disease process will progress, limiting the  benefit of any interventions.  I discussed in depth with the patient the need for long term surveillance to improve the primary assisted patency of his bypass.  The patient agrees to cooperate with such. The patient is currently not on on statin as not medically indicated.  The patient is currently on an anti-platelet: Plavix.  Thank you for allowing us to participate in this patient's care.   Leonides SakeBrian Lynx Goodrich, MD, FACS [facility]  [place] Office: [phone] Pager: [phone]

## 2016-04-11 ENCOUNTER — Ambulatory Visit (INDEPENDENT_AMBULATORY_CARE_PROVIDER_SITE_OTHER): Payer: BLUE CROSS/BLUE SHIELD | Admitting: Vascular Surgery

## 2016-04-11 ENCOUNTER — Ambulatory Visit (INDEPENDENT_AMBULATORY_CARE_PROVIDER_SITE_OTHER)
Admission: RE | Admit: 2016-04-11 | Discharge: 2016-04-11 | Disposition: A | Discharge status: Home / Self Care | Payer: BLUE CROSS/BLUE SHIELD | Source: Ambulatory Visit | Attending: Vascular Surgery | Admitting: Vascular Surgery

## 2016-04-11 ENCOUNTER — Ambulatory Visit (HOSPITAL_COMMUNITY)
Admission: RE | Admit: 2016-04-11 | Discharge: 2016-04-11 | Disposition: A | Discharge status: Home / Self Care | Payer: BLUE CROSS/BLUE SHIELD | Source: Ambulatory Visit | Attending: Vascular Surgery | Admitting: Vascular Surgery

## 2016-04-11 ENCOUNTER — Encounter: Payer: Self-pay | Admitting: Vascular Surgery

## 2016-04-11 VITALS — BP 178/83 | HR 56 | Temp 98.1°F | Resp 14 | Ht 59.0 in | Wt 148.0 lb

## 2016-04-11 DIAGNOSIS — I739 Peripheral vascular disease, unspecified: Secondary | ICD-10-CM | POA: Diagnosis present

## 2016-04-11 DIAGNOSIS — S7012XD Contusion of left thigh, subsequent encounter: Secondary | ICD-10-CM

## 2016-04-16 NOTE — Addendum Note (Signed)
Addended by: Burton ApleyPETTY, Lorali Khamis A on: 04/16/2016 02:57 PM   Modules accepted: Orders

## 2016-07-09 ENCOUNTER — Encounter: Payer: Self-pay | Admitting: Vascular Surgery

## 2016-07-15 NOTE — Progress Notes (Signed)
Established Endovascular Interventions   History of Present Illness  Pamela Perez is a 66 y.o. (07/28/1950) female who presents with chief complaint: none.  Previous operation(s) include:   1.  PTA L SFA for in-stent restenosis., Jetstream atherectomy L SFA stent (11/14/15) 2.  Washout of L thigh wound, VAC placement (07/20/14) 3. L SFA PTA+S for massive L thigh hematoma related to traumatic fall  (07/16/14)  The patient's symptoms have improved.  The patient's symptoms are: none. The patient's treatment regimen currently included: maximal medical management.  The patient's PMH, PSH, SH, and FamHx are unchanged from 04/11/16.  Current Outpatient Prescriptions  Medication Sig Dispense Refill  . clopidogrel (PLAVIX) 75 MG tablet Take 1 tablet (75 mg total) by mouth daily. 90 tablet 3  . GARLIC PO Take 1 tablet by mouth every other day.    . lisinopril-hydrochlorothiazide (PRINZIDE,ZESTORETIC) 20-12.5 MG per tablet Take 0.5 tablets by mouth daily.    . metoprolol succinate (TOPROL-XL) 100 MG 24 hr tablet Take 100 mg by mouth daily.    . metroNIDAZOLE (METROCREAM) 0.75 % cream Apply 1 application topically daily.     No current facility-administered medications for this visit.    On ROS today: no intermittent claudication , no rest pain   Physical Examination  There were no vitals filed for this visit.  There is no height or weight on file to calculate BMI.  General: Alert, O x 3, WD,NAD  Pulmonary: Sym exp, good B air movt,CTA B  Cardiac: RRR, Nl S1, S2, no Murmurs, No rubs, No S3,S4  Vascular: Vessel Right Left  Radial Palpable Palpable  Brachial Palpable Palpable  Carotid Palpable, No Bruit Palpable, No Bruit  Aorta Not palpable N/A  Femoral Palpable Palpable  Popliteal Not palpable Not palpable  PT Palpable Palpable  DP Palpable Palpable   Gastrointestinal: soft, non-distended, non-tender to palpation, No guarding or rebound, no HSM, no masses,  no CVAT B, No palpable prominent aortic pulse,    Musculoskeletal: M/S 5/5 throughout  , Extremities without ischemic changes  , No edema present, No LDS present  Neurologic: CN 2-12 intact , Pain and light touch intact in extremities , Motor exam as listed above   Non-Invasive Vascular Imaging ABI (07/18/2016 )  R:   ABI: 0.91 (1.19),   PT: tri  DP: tri  TBI:  1.24  L:   ABI: 1.19 (1.19),   PT: tri  DP: tri  TBI: 1.10  LLE Duplex (07/18/2016 )  Patent stent without evidence of restenosis: 69-193 c/s   Medical Decision Making  Pamela LovettRebecca Trahan is a 66 y.o. (03/14/1950) female who presents with: s/p L SFA PTA+S for massive L thigh hematoma related to traumatic fall, s/p recent atherectomy and PTA L SFA for in-stent restenosis.   Based on the patient's vascular studies and examination, I have offered the patient: BLE ABI, bypass duplex, and aortoiliac duplex in 3 months.  I discussed in depth with the patient the nature of atherosclerosis, and emphasized the importance of maximal medical management including strict control of blood pressure, blood glucose, and lipid levels, obtaining regular exercise, and cessation of smoking.    The patient is aware that without maximal medical management the underlying atherosclerotic disease process will progress, limiting the benefit of any interventions.  I discussed in depth with the patient the need for long term surveillance to improve the primary assisted patency of his bypass.  The patient agrees to cooperate with such.  The patient is currently not  on on statin as not medically indicated.   The patient is currently on an anti-platelet: Plavix.  Thank you for allowing [name] to participate in this patient's care.   Leonides Sake, MD, FACS [facility] Office: [phone] Pager: [phone]

## 2016-07-18 ENCOUNTER — Encounter: Payer: Self-pay | Admitting: Vascular Surgery

## 2016-07-18 ENCOUNTER — Ambulatory Visit (INDEPENDENT_AMBULATORY_CARE_PROVIDER_SITE_OTHER): Payer: BLUE CROSS/BLUE SHIELD | Admitting: Vascular Surgery

## 2016-07-18 ENCOUNTER — Ambulatory Visit (HOSPITAL_COMMUNITY)
Admission: RE | Admit: 2016-07-18 | Discharge: 2016-07-18 | Disposition: A | Discharge status: Home / Self Care | Payer: BLUE CROSS/BLUE SHIELD | Source: Ambulatory Visit | Attending: Vascular Surgery | Admitting: Vascular Surgery

## 2016-07-18 VITALS — BP 154/76 | HR 51 | Temp 97.4°F | Resp 18 | Ht 59.0 in | Wt 151.5 lb

## 2016-07-18 DIAGNOSIS — Y848 Other medical procedures as the cause of abnormal reaction of the patient, or of later complication, without mention of misadventure at the time of the procedure: Secondary | ICD-10-CM | POA: Insufficient documentation

## 2016-07-18 DIAGNOSIS — S7012XD Contusion of left thigh, subsequent encounter: Secondary | ICD-10-CM | POA: Insufficient documentation

## 2016-07-18 DIAGNOSIS — R9439 Abnormal result of other cardiovascular function study: Secondary | ICD-10-CM | POA: Insufficient documentation

## 2016-07-18 DIAGNOSIS — I739 Peripheral vascular disease, unspecified: Secondary | ICD-10-CM | POA: Diagnosis not present

## 2016-07-18 DIAGNOSIS — R0989 Other specified symptoms and signs involving the circulatory and respiratory systems: Secondary | ICD-10-CM | POA: Insufficient documentation

## 2016-07-18 LAB — VAS US LOWER EXTREMITY ARTERIAL DUPLEX
LSFPPSV: 124 cm/s
Left super femoral dist sys PSV: -90 cm/s
Left super femoral mid sys PSV: -89 cm/s

## 2016-07-23 NOTE — Addendum Note (Signed)
Addended by: Burton ApleyPETTY, Jhamari Markowicz A on: 07/23/2016 02:09 PM   Modules accepted: Orders

## 2016-10-28 ENCOUNTER — Other Ambulatory Visit: Payer: Self-pay | Admitting: *Deleted

## 2016-10-28 DIAGNOSIS — I739 Peripheral vascular disease, unspecified: Secondary | ICD-10-CM

## 2016-10-28 MED ORDER — CLOPIDOGREL BISULFATE 75 MG PO TABS: 75.0000 mg | ORAL_TABLET | Freq: Every day | ORAL | 3 refills | Status: DC

## 2016-10-29 NOTE — Progress Notes (Signed)
Established Endovascular Interventions   History of Present Illness   Pamela Perez is a 66 y.o. (Jun 08, 1950) female who presents with chief complaint: none.    Previous operation(s) include:   1. PTA L SFA for in-stent restenosis., Jetstream atherectomy L SFA stent (11/14/15) 2. Washout of L thigh wound, VAC placement (07/20/14) 3. L SFA PTA+S for massive L thigh hematoma related to traumatic fall (07/16/14)  The patient's symptoms have improved. The patient's symptoms are: none. The patient's treatment regimen currently included: maximal medical management.  The patient's PMH, PSH, SH, and FamHx are unchanged from 07/18/16  Current Outpatient Prescriptions  Medication Sig Dispense Refill  . clopidogrel (PLAVIX) 75 MG tablet Take 1 tablet (75 mg total) by mouth daily. 90 tablet 3  . GARLIC PO Take 1 tablet by mouth every other day.    . lisinopril-hydrochlorothiazide (PRINZIDE,ZESTORETIC) 20-12.5 MG per tablet Take 0.5 tablets by mouth daily.    . metoprolol succinate (TOPROL-XL) 100 MG 24 hr tablet Take 100 mg by mouth daily.    . metroNIDAZOLE (METROCREAM) 0.75 % cream Apply 1 application topically daily.     No current facility-administered medications for this visit.     On ROS today: no intermittent claudication , no rest pain.   Physical Examination   Vitals:   10/31/16 1455 10/31/16 1456  BP: (!) 156/73 (!) 158/75  Pulse: (!) 54   Resp: 18   Temp: 97.7 F (36.5 C)   TempSrc: Oral   SpO2: 99%   Weight: 150 lb 6.4 oz (68.2 kg)   Height:  (1.499 m)    Body mass index is 30.38 kg/m.  General Alert, O x 3, WD, NAD  Pulmonary Sym exp, good B air movt, CTA B  Cardiac RRR, Nl S1, S2, no Murmurs, No rubs, No S3,S4  Vascular Vessel Right Left  Radial Palpable Palpable  Brachial Palpable Palpable  Carotid Palpable, No Bruit Palpable, No Bruit  Aorta Not palpable N/A  Femoral Palpable Palpable  Popliteal Not palpable Not palpable  PT Palpable  Palpable  DP Palpable Palpable    Gastro- intestinal soft, non-distended, non-tender to palpation, No guarding or rebound, no HSM, no masses, no CVAT B, No palpable prominent aortic pulse,    Musculo- skeletal M/S 5/5 throughout  , Extremities without ischemic changes  , No edema present, No visible varicosities , No Lipodermatosclerosis present  Neurologic Pain and light touch intact in extremities  , Motor exam as listed above    Non-Invasive Vascular imaging   ABI (10/31/16)  R:   ABI: 1.08 (0.91),   PT: tri  DP: tri  TBI:  0.77  L:   ABI: 1.05 (1.19),   PT: tri  DP: tri  TBI: 0.68  Aortoiliac Duplex (10/31/2016)  Proximal to stent 134 c/s  Within stent: 181-239 c/s (Vr 1.8), previously PSVmax 193 c/s   Medical Decision Making   Pamela Perez is a 66 y.o. female who presents with:  s/p L SFA PTA+S for massive L thigh hematoma related to traumatic fall, s/p recent atherectomy and PTA L SFA for in-stent restenosis.   Velocity increase in L SFA stent is not significant.  Usually in-stent increase in stenosis is expected with Vr >= 3.0 or PSV > 300-350 c/s usual is the threshold for re-intervention.  Based on the patient's vascular studies and examination, I have offered the patient: q3 month BLE ABI and LLE duplex.  I discussed in depth with the patient the nature of atherosclerosis,  and emphasized the importance of maximal medical management including strict control of blood pressure, blood glucose, and lipid levels, antiplatelet agents, obtaining regular exercise, and cessation of smoking.    The patient is aware that without maximal medical management the underlying atherosclerotic disease process will progress, limiting the benefit of any interventions. The patient is currently not on on statin as not medically indicated.  The patient is currently on an anti-platelet: Plavix.  Thank you for allowing us to participate in this patient's care.   Leonides SakeBrian  Frazier Balfour, MD, FACS [facility] [place] Office: [phone] Pager: [phone]

## 2016-10-31 ENCOUNTER — Ambulatory Visit (INDEPENDENT_AMBULATORY_CARE_PROVIDER_SITE_OTHER): Payer: BLUE CROSS/BLUE SHIELD | Admitting: Vascular Surgery

## 2016-10-31 ENCOUNTER — Encounter: Payer: Self-pay | Admitting: Vascular Surgery

## 2016-10-31 ENCOUNTER — Ambulatory Visit (HOSPITAL_COMMUNITY)
Admission: RE | Admit: 2016-10-31 | Discharge: 2016-10-31 | Disposition: A | Discharge status: Home / Self Care | Payer: BLUE CROSS/BLUE SHIELD | Source: Ambulatory Visit | Attending: Vascular Surgery | Admitting: Vascular Surgery

## 2016-10-31 ENCOUNTER — Ambulatory Visit (INDEPENDENT_AMBULATORY_CARE_PROVIDER_SITE_OTHER)
Admission: RE | Admit: 2016-10-31 | Discharge: 2016-10-31 | Disposition: A | Discharge status: Home / Self Care | Payer: BLUE CROSS/BLUE SHIELD | Source: Ambulatory Visit | Attending: Vascular Surgery | Admitting: Vascular Surgery

## 2016-10-31 VITALS — BP 158/75 | HR 54 | Temp 97.7°F | Resp 18 | Ht 59.0 in | Wt 150.4 lb

## 2016-10-31 DIAGNOSIS — S7012XD Contusion of left thigh, subsequent encounter: Secondary | ICD-10-CM | POA: Diagnosis present

## 2016-10-31 DIAGNOSIS — S75002D Unspecified injury of femoral artery, left leg, subsequent encounter: Secondary | ICD-10-CM

## 2016-10-31 DIAGNOSIS — X58XXXD Exposure to other specified factors, subsequent encounter: Secondary | ICD-10-CM | POA: Insufficient documentation

## 2016-10-31 LAB — VAS US LOWER EXTREMITY ARTERIAL DUPLEX: LSFPPSV: -138 cm/s

## 2016-11-10 NOTE — Addendum Note (Signed)
Addended by: Burton Apley A on: 11/10/2016 10:34 AM   Modules accepted: Orders

## 2016-12-09 ENCOUNTER — Other Ambulatory Visit: Payer: Self-pay | Admitting: Vascular Surgery

## 2016-12-09 DIAGNOSIS — I739 Peripheral vascular disease, unspecified: Secondary | ICD-10-CM

## 2016-12-09 MED ORDER — CLOPIDOGREL BISULFATE 75 MG PO TABS: 75.0000 mg | ORAL_TABLET | Freq: Every day | ORAL | 3 refills | Status: DC

## 2017-01-27 NOTE — Progress Notes (Signed)
Established Endovascular Interventions   History of Present Illness   Pamela Perez is a 66 y.o. (10/07/1950) female who presents with chief complaint: no problems..    Previous operation(s) include:   1. PTA L SFA for in-stent restenosis., Jetstream atherectomy L SFA stent (11/14/15) 2. Washout of L thigh wound, VAC placement (07/20/14) 3. L SFA PTA+S for massive L thigh hematoma related to traumatic fall (07/16/14)  The patient's symptoms have improved. The patient's symptoms are: intermittent short duration neuropathic pain in left thigh incision. The patient's treatment regimen currently included: maximal medical management.  The patient's PMH, PSH, SH, and FamHx are unchanged from 10/31/16  Current Outpatient Medications  Medication Sig Dispense Refill  . clopidogrel (PLAVIX) 75 MG tablet Take 1 tablet (75 mg total) by mouth daily. 90 tablet 3  . GARLIC PO Take 1 tablet by mouth every other day.    . lisinopril-hydrochlorothiazide (PRINZIDE,ZESTORETIC) 20-12.5 MG per tablet Take 0.5 tablets by mouth daily.    . metoprolol succinate (TOPROL-XL) 100 MG 24 hr tablet Take 100 mg by mouth daily.    . metroNIDAZOLE (METROCREAM) 0.75 % cream Apply 1 application topically daily.     No current facility-administered medications for this visit.    On ROS today: no intermittent claudication , intermittent short duration neuropathic pain in left thigh incision   Physical Examination   Vitals:   01/30/17 1102  BP: 139/71  Pulse: (!) 58  Resp: 16  Temp: 98.4 F (36.9 C)  TempSrc: Oral  SpO2: 97%  Weight: 150 lb (68 kg)  Height: 4\' 11"  (1.499 m)   Body mass index is 30.3 kg/m.  General Alert, O x 3, WD, NAD  Pulmonary Sym exp, good B air movt, CTA B  Cardiac RRR, Nl S1, S2, no Murmurs, No rubs, No S3,S4  Vascular Vessel Right Left  Radial Palpable Palpable  Brachial Palpable Palpable  Carotid Palpable, No Bruit Palpable, No Bruit  Aorta Not palpable N/A    Femoral Palpable Palpable  Popliteal Not palpable Not palpable  PT Palpable Palpable  DP Palpable Palpable    Gastro- intestinal soft, non-distended, non-tender to palpation, No guarding or rebound, no HSM, no masses, no CVAT B, No palpable prominent aortic pulse,    Musculo- skeletal M/S 5/5 throughout  , Extremities without ischemic changes  , No edema present, No visible varicosities , No Lipodermatosclerosis present  Neurologic Pain and light touch intact in extremities  , Motor exam as listed above    Non-invasive Vascular Imaging   ABI (01/30/2017)  R:   ABI: 0.99 (1.08),   PT: tri  DP: tri  TBI:  0.80 (0.77)  L:   ABI: 1.06 (1.05),   PT: tri  DP: tri  TBI: 1.01 (0.68)  LLE Duplex (01/30/2017)  Proximal to stent 95 c/s  Within stent: 81-150 c/s (Vr <2), PSVmax 150 c/s   Medical Decision Making   Pamela Perez is a 66 y.o. (04/07/1950) female who presents with:  s/p L SFA PTA+S for massive L thigh hematoma related to traumatic fall, s/p recent atherectomy and PTA L SFA for in-stent restenosis.   Prior PSV cannot be found on today's study.  Based on the patient's vascular studies and examination, I have offered the patient: q6 month BLE ABI and LLE duplex.  I discussed in depth with the patient the nature of atherosclerosis, and emphasized the importance of maximal medical management including strict control of blood pressure, blood glucose, and lipid levels, antiplatelet agents,  obtaining regular exercise, and cessation of smoking.    The patient is aware that without maximal medical management the underlying atherosclerotic disease process will progress, limiting the benefit of any interventions.  The patient is currently not on on statin as not medically indicated.   The patient is currently on an anti-platelet: Plavix.  Thank you for allowing us to participate in this patient's care.   Leonides SakeBrian Chen, MD, FACS [facility] [place] Office: [phone] Pager: [phone]

## 2017-01-30 ENCOUNTER — Ambulatory Visit (HOSPITAL_COMMUNITY)
Admission: RE | Admit: 2017-01-30 | Discharge: 2017-01-30 | Disposition: A | Discharge status: Home / Self Care | Payer: BLUE CROSS/BLUE SHIELD | Source: Ambulatory Visit | Attending: Vascular Surgery | Admitting: Vascular Surgery

## 2017-01-30 ENCOUNTER — Encounter: Payer: Self-pay | Admitting: Vascular Surgery

## 2017-01-30 ENCOUNTER — Ambulatory Visit (INDEPENDENT_AMBULATORY_CARE_PROVIDER_SITE_OTHER): Payer: BLUE CROSS/BLUE SHIELD | Admitting: Vascular Surgery

## 2017-01-30 ENCOUNTER — Ambulatory Visit (INDEPENDENT_AMBULATORY_CARE_PROVIDER_SITE_OTHER)
Admission: RE | Admit: 2017-01-30 | Discharge: 2017-01-30 | Disposition: A | Discharge status: Home / Self Care | Payer: BLUE CROSS/BLUE SHIELD | Source: Ambulatory Visit | Attending: Vascular Surgery | Admitting: Vascular Surgery

## 2017-01-30 VITALS — BP 139/71 | HR 58 | Temp 98.4°F | Resp 16 | Ht 59.0 in | Wt 150.0 lb

## 2017-01-30 DIAGNOSIS — X58XXXD Exposure to other specified factors, subsequent encounter: Secondary | ICD-10-CM | POA: Diagnosis not present

## 2017-01-30 DIAGNOSIS — S75002D Unspecified injury of femoral artery, left leg, subsequent encounter: Secondary | ICD-10-CM | POA: Diagnosis not present

## 2017-01-30 DIAGNOSIS — S7012XD Contusion of left thigh, subsequent encounter: Secondary | ICD-10-CM

## 2017-02-18 NOTE — Addendum Note (Signed)
Addended by: Burton ApleyPETTY, Terrick Allred A on: 02/18/2017 04:10 PM   Modules accepted: Orders

## 2017-07-17 NOTE — Progress Notes (Signed)
Established Endovascular Interventions   History of Present Illness   Pamela LovettRebecca Perez is a 67 y.o. (01/30/1951) female who presents with chief complaint: no complaints.    Prior procedures include: 1.  PTA L SFA for in-stent restenosis, Jetstream atherectomy L SFA stent (11/14/15) 2. Washout of L thigh wound, VAC placement (07/20/14) 3. L SFA PTA+S for massive L thigh hematoma related to traumatic fall (07/16/14)  The patient's symptoms have NOT progressed.  The patient's symptoms are: none.  The patient's treatment regimen currently included: maximal medical management and continued plavix.  The patient's PMH, PSH, SH, and FamHx were reviewed on 07/22/17 are unchanged from 01/30/17.  Current Outpatient Medications  Medication Sig Dispense Refill  . clopidogrel (PLAVIX) 75 MG tablet Take 1 tablet (75 mg total) by mouth daily. 90 tablet 3  . GARLIC PO Take 1 tablet by mouth every other day.    . lisinopril-hydrochlorothiazide (PRINZIDE,ZESTORETIC) 20-12.5 MG per tablet Take 0.5 tablets by mouth daily.    . metoprolol succinate (TOPROL-XL) 100 MG 24 hr tablet Take 100 mg by mouth daily.    . metroNIDAZOLE (METROCREAM) 0.75 % cream Apply 1 application topically daily.     No current facility-administered medications for this visit.     On ROS today: no intermittent claudication , no rest pain.   Physical Examination   Vitals:   07/22/17 1103  BP: (!) 141/66  Pulse: (!) 54  Resp: 14  Temp: (!) 97.3 F (36.3 C)  TempSrc: Oral  SpO2: 99%  Weight: 149 lb 3.2 oz (67.7 kg)  Height: 4\' 11"  (1.499 m)   Body mass index is 30.13 kg/m.  General Alert, O x 3, WD, NAD  Pulmonary Sym exp, good B air movt, CTA B  Cardiac RRR, Nl S1, S2, no Murmurs, No rubs, No S3,S4  Vascular Vessel Right Left  Radial Palpable Palpable  Brachial Palpable Palpable  Carotid Palpable, No Bruit Palpable, No Bruit  Aorta Not palpable N/A  Femoral Palpable Palpable  Popliteal Not palpable Not  palpable  PT Palpable Palpable  DP Palpable Palpable    Gastro- intestinal soft, non-distended, non-tender to palpation, No guarding or rebound, no HSM, no masses, no CVAT B, No palpable prominent aortic pulse,    Musculo- skeletal M/S 5/5 throughout  , Extremities without ischemic changes  , No edema present, No visible varicosities , No Lipodermatosclerosis present, well healed L thigh wound  Neurologic Pain and light touch intact in extremities , Motor exam as listed above    Non-Invasive Vascular imaging   ABI (07/22/2017)  R:   ABI: 1.07 (0.99),   PT: tri  DP: tri  TBI:  0.75  L:   ABI: 1.05 (1.06),   PT: tri  DP: tri  TBI: 0.89  RLE (07/22/2017)  Triphasic throughout including stented segment  Widely patent stent   Medical Decision Making   Pamela LovettRebecca Perez is a 67 y.o. female who presents with:  Fall leading to massive L thigh hematoma requiring placement of L SFA covered stent to managed muscular bleeding, s/p recent atherectomy of L SFA stent for in-stent restenosis   Based on the patient's vascular studies and examination, I have offered the patient: q6 month ABI and LLE arterial duplex.  I discussed in depth with the patient the nature of atherosclerosis, and emphasized the importance of maximal medical management including strict control of blood pressure, blood glucose, and lipid levels, antiplatelet agents, obtaining regular exercise, and cessation of smoking.    The  patient is aware that without maximal medical management the underlying atherosclerotic disease process will progress, limiting the benefit of any interventions.  The patient is currently not on on statin as not medically indicated.   The patient is currently on an anti-platelet: Plavix.  Thank you for allowing [name] to participate in this patient's care.   Leonides Sake, MD, FACS [facility] [place] Office: [phone] Pager: [phone]

## 2017-07-22 ENCOUNTER — Encounter: Payer: Self-pay | Admitting: Vascular Surgery

## 2017-07-22 ENCOUNTER — Ambulatory Visit (INDEPENDENT_AMBULATORY_CARE_PROVIDER_SITE_OTHER): Payer: Self-pay | Admitting: Vascular Surgery

## 2017-07-22 ENCOUNTER — Ambulatory Visit (HOSPITAL_COMMUNITY)
Admission: RE | Admit: 2017-07-22 | Discharge: 2017-07-22 | Disposition: A | Discharge status: Home / Self Care | Payer: Self-pay | Source: Ambulatory Visit | Attending: Vascular Surgery | Admitting: Vascular Surgery

## 2017-07-22 ENCOUNTER — Ambulatory Visit (INDEPENDENT_AMBULATORY_CARE_PROVIDER_SITE_OTHER)
Admission: RE | Admit: 2017-07-22 | Discharge: 2017-07-22 | Disposition: A | Discharge status: Home / Self Care | Payer: Self-pay | Source: Ambulatory Visit | Attending: Vascular Surgery | Admitting: Vascular Surgery

## 2017-07-22 VITALS — BP 141/66 | HR 54 | Temp 97.3°F | Resp 14 | Ht 59.0 in | Wt 149.2 lb

## 2017-07-22 DIAGNOSIS — S7012XD Contusion of left thigh, subsequent encounter: Secondary | ICD-10-CM

## 2017-07-22 DIAGNOSIS — X58XXXD Exposure to other specified factors, subsequent encounter: Secondary | ICD-10-CM | POA: Insufficient documentation

## 2017-07-22 DIAGNOSIS — S75002D Unspecified injury of femoral artery, left leg, subsequent encounter: Secondary | ICD-10-CM

## 2017-07-31 ENCOUNTER — Other Ambulatory Visit: Payer: Self-pay

## 2017-07-31 DIAGNOSIS — I739 Peripheral vascular disease, unspecified: Secondary | ICD-10-CM

## 2018-01-28 ENCOUNTER — Ambulatory Visit (HOSPITAL_COMMUNITY)
Admission: RE | Admit: 2018-01-28 | Discharge: 2018-01-28 | Disposition: A | Discharge status: Home / Self Care | Payer: Medicare Other | Source: Ambulatory Visit | Attending: Family | Admitting: Family

## 2018-01-28 ENCOUNTER — Ambulatory Visit (INDEPENDENT_AMBULATORY_CARE_PROVIDER_SITE_OTHER)
Admission: RE | Admit: 2018-01-28 | Discharge: 2018-01-28 | Disposition: A | Discharge status: Home / Self Care | Payer: Medicare Other | Source: Ambulatory Visit | Attending: Family | Admitting: Family

## 2018-01-28 ENCOUNTER — Encounter: Payer: Self-pay | Admitting: Family

## 2018-01-28 ENCOUNTER — Ambulatory Visit (INDEPENDENT_AMBULATORY_CARE_PROVIDER_SITE_OTHER): Payer: Medicare Other | Admitting: Family

## 2018-01-28 VITALS — BP 155/65 | HR 51 | Temp 97.0°F | Resp 12 | Ht 59.0 in | Wt 148.0 lb

## 2018-01-28 DIAGNOSIS — I739 Peripheral vascular disease, unspecified: Secondary | ICD-10-CM | POA: Diagnosis present

## 2018-01-28 DIAGNOSIS — Z95820 Peripheral vascular angioplasty status with implants and grafts: Secondary | ICD-10-CM | POA: Diagnosis not present

## 2018-01-28 DIAGNOSIS — S7012XD Contusion of left thigh, subsequent encounter: Secondary | ICD-10-CM

## 2018-01-28 NOTE — Patient Instructions (Signed)

## 2018-01-28 NOTE — Progress Notes (Signed)
[facility] [place]   CC: Follow up peripheral artery occlusive disease  History of Present Illness Pamela Perez is a 67 y.o. female who returns for follow up surveillance.  She initially presented >6 hours after falling out a golf cart which resulted initially in a left prepatellar laceration which was repaired in the ED. She then progressed to develop a large hematoma in the left distal thigh. A CTA was completed which demonstrated possible superficial femoral artery branches feeding the hematoma. The skin overlying the hematoma was tense and ischemic in appearance, so Dr. Imogene Burn felt immediate intervention was indicated. Dr. Imogene Burn stented the Left superficial femoral artery with a covered stent to cover the feeding branches to the hematoma on 07/16/14 and then evacuated a large hematoma on 07/20/14.   Prior procedures include: 1.  PTA L SFA for in-stent restenosis, Jetstream atherectomy L SFA stent (11/14/15) by Dr. Randie Heinz 2. Washout of L thigh wound, VAC placement (07/20/14) 3. L SFA PTA+S for massive L thigh hematoma related to traumatic fall (07/16/14) by Dr. Imogene Burn  The patient's symptoms have NOT progressed.  The patient's symptoms are: none.  The patient's treatment regimen currently included: maximal medical management and continued plavix.  In the last month or so her left calf has started to hurt after walking about 10 minutes, relieved by rest, no claudicant up until about a month ago.    Diabetic: No Tobacco use: non-smoker  Pt meds include: Statin :No, has never been on a statin Betablocker: Yes ASA: No Other anticoagulants/antiplatelets: Plavix  Past Medical History:  Diagnosis Date  . Hypertension     Social History Social History   Tobacco Use  . Smoking status: Never Smoker  . Smokeless tobacco: Never Used  Substance Use Topics  . Alcohol use: No    Alcohol/week: 0.0 standard drinks  . Drug use: No    Family History Family History   Problem Relation Age of Onset  . Arthritis Mother        Gout  . Kidney failure Mother   . Heart disease Father        Before age 23  . Hyperlipidemia Father   . Heart attack Father     Past Surgical History:  Procedure Laterality Date  . APPLICATION OF WOUND VAC Left 07/20/2014   Procedure: APPLICATION OF WOUND VAC;  Surgeon: Fransisco Hertz, MD;  Location: [facility] OR;  Service: Vascular;  Laterality: Left;  . CHOLECYSTECTOMY    . I&D EXTREMITY Left 07/20/2014   Procedure: IRRIGATION AND DEBRIDEMENT EXTREMITY;  Surgeon: Fransisco Hertz, MD;  Location: [facility] OR;  Service: Vascular;  Laterality: Left;  . INSERTION OF ILIAC STENT Left 07/16/2014   Procedure: Left Leg Angiogram, Stenting of Left Superficial Femoral Artery, Repair of Pre-Patella Laceration, Evacuation of Left Thigh Hematoma;  Surgeon: Fransisco Hertz, MD;  Location: [facility] OR;  Service: Vascular;  Laterality: Left;  . PERIPHERAL VASCULAR CATHETERIZATION Left 11/14/2015   Procedure: Lower Extremity Angiography;  Surgeon: Maeola Harman, MD;  Location: [facility] INVASIVE CV LAB;  Service: Cardiovascular;  Laterality: Left;  . PERIPHERAL VASCULAR CATHETERIZATION Left 11/14/2015   Procedure: Peripheral Vascular Atherectomy;  Surgeon: Maeola Harman, MD;  Location: [facility] INVASIVE CV LAB;  Service: Cardiovascular;  Laterality: Left;    No Known Allergies  Current Outpatient Medications  Medication Sig Dispense Refill  . clopidogrel (PLAVIX) 75 MG tablet Take 1 tablet (75 mg total) by mouth daily. 90 tablet 3  . GARLIC PO Take  1 tablet by mouth every other day.    . lisinopril-hydrochlorothiazide (PRINZIDE,ZESTORETIC) 20-12.5 MG per tablet Take 0.5 tablets by mouth daily.    . metoprolol succinate (TOPROL-XL) 100 MG 24 hr tablet Take 100 mg by mouth daily.    . metroNIDAZOLE (METROCREAM) 0.75 % cream Apply 1 application topically daily.     No current facility-administered medications for this visit.     ROS: See HPI for pertinent  positives and negatives.   Physical Examination  Vitals:   01/28/18 1006 01/28/18 1011  BP: (!) 153/69 (!) 155/65  Pulse: (!) 51   Resp: 12   Temp: (!) 97 F (36.1 C)   SpO2: 98%   Weight: 148 lb (67.1 kg)   Height: 4\' 11"  (1.499 m)    Body mass index is 29.89 kg/m.  General: A&O x 3, WDWN, female. Gait: normal HENT: No gross abnormalities.  Eyes: PERRLA. Pulmonary: Respirations are non labored, CTAB, good air movement in all fields Cardiac: regular rhythm, bradycardic (on a beta blocker), no detected murmur.         Carotid Bruits Right Left   Negative Negative   Radial pulses are 2+ palpable bilaterally   Adominal aortic pulse is not palpable                         VASCULAR EXAM: Extremities without ischemic changes, without Gangrene; without open wounds.                                                                                                          LE Pulses Right Left       FEMORAL  2+ palpable  2+ palpable        POPLITEAL  not palpable   not palpable       POSTERIOR TIBIAL  2+ palpable   not palpable        DORSALIS PEDIS      ANTERIOR TIBIAL not palpable  not palpable    Abdomen: softly obese, NT, no palpable masses. Skin: no rashes, no cellulitis, no ulcers noted. Musculoskeletal: no muscle wasting or atrophy.  Neurologic: A&O X 3; appropriate affect, Sensation is normal; MOTOR FUNCTION:  moving all extremities equally, motor strength 5/5 throughout. Speech is fluent/normal. CN 2-12 intact. Psychiatric: Thought content is normal, mood appropriate for clinical situation.     ASSESSMENT: Pamela Perez is a 67 y.o. female who present with: Fall leading to massive L thigh hematoma requiring placement of L SFA covered stent in 2016 by Dr. Imogene Burnhen to managed muscular bleeding, s/p atherectomy of L SFA stent for in-stent restenosis in 2017 by Dr. Randie Heinzain.   Left SFA stent occlusion that, by history likely occurred a month ago. Left ABI declined from  100% to 62%, right remains at 100%.  Left SFA stent was widely patent on 07-22-17.   0.70 serum creatinine on 11-14-15, last result on file.   Consider statin therapy which is associated with a greater reduction in CVD risk and improved endothelial function , will defer  to pt PCP.  I discussed with pt that Dr. Darrick Penna thinks it is unlikely that the left SFA re-occlusion could be re-canulated by catheter ballooning or stenting, that she would likely need a left LE arterial bypass.  I also discussed wit her a graduated walking program to improve the collateral arterial perfusion.  She will let us know if her claudication becomes worse, or she would like to have the aortogram to evaluate and possibly intervene on the left SFA occlusion, that started a month ago according to her claudication symptoms.    DATA  Left Duplex Findings (01-28-18):  +-----------+--------+-----+--------+----------+----------------------+        PSV cm/sRatioStenosisWaveform Comments         +-----------+--------+-----+--------+----------+----------------------+  CFA Distal 136          biphasic              +-----------+--------+-----+--------+----------+----------------------+  DFA    99          biphasic              +-----------+--------+-----+--------+----------+----------------------+  SFA Prox  42          biphasic collaterals visualized  +-----------+--------+-----+--------+----------+----------------------+  SFA Mid         occluded                  +-----------+--------+-----+--------+----------+----------------------+  SFA Distal        occluded                  +-----------+--------+-----+--------+----------+----------------------+  POP Prox  22          monophasic              +-----------+--------+-----+--------+----------+----------------------+  POP Distal 31          monophasic             +-----------+--------+-----+--------+----------+----------------------+  ATA Distal 13          monophasic             +-----------+--------+-----+--------+----------+----------------------+  PTA Distal 26          monophasic             +-----------+--------+-----+--------+----------+----------------------+  PERO Distal10          monophasic             +-----------+--------+-----+--------+----------+----------------------+    A focal velocity elevation of 211 cm/s was obtained at distal superficial femoral artery with a VR of 5.9. Findings are characteristic of 75-99% stenosis.    Left Stent(s):  +--------------------------+--------+--------+----------+----------------------+  distal superficial femoralPSV cm/sStenosisWaveform Comments         artery                                    +--------------------------+--------+--------+----------+----------------------+  Prox to Stent       55       monophasiccollaterals visualized  +--------------------------+--------+--------+----------+----------------------+  Proximal Stent          occluded                  +--------------------------+--------+--------+----------+----------------------+  Mid Stent             occluded                  +--------------------------+--------+--------+----------+----------------------+  Distal Stent           occluded                  +--------------------------+--------+--------+----------+----------------------+  Distal to Stent      34       monophasic              +--------------------------+--------+--------+----------+----------------------+      Summary:  Left: Total occlusion noted in the superficial femoral artery. Stent not visualized. Superficial femoral artery appears to occlude proximal to the stent, with reconstitution by collaterals noted in the distal thigh. ABIs have decreased in the left lower   extremity compared to previous exam on 07/22/2017.     ABI Findings (01-28-18):  +---------+------------------+-----+---------+--------+  Right  Rt Pressure (mmHg)IndexWaveform Comment   +---------+------------------+-----+---------+--------+  Brachial 146                      +---------+------------------+-----+---------+--------+  PTA   155        1.06 triphasic      +---------+------------------+-----+---------+--------+  DP    152        1.04 triphasic      +---------+------------------+-----+---------+--------+  Great Toe124        0.85 Normal        +---------+------------------+-----+---------+--------+    +---------+------------------+-----+----------+-------+  Left   Lt Pressure (mmHg)IndexWaveform Comment  +---------+------------------+-----+----------+-------+  Brachial 141                      +---------+------------------+-----+----------+-------+  PTA   89        0.61 monophasic      +---------+------------------+-----+----------+-------+  DP    91        0.62 monophasic      +---------+------------------+-----+----------+-------+  Great Toe72        0.49 dampened       +---------+------------------+-----+----------+-------+    +-------+-----------+-----------+------------+------------+  ABI/TBIToday's ABIToday's TBIPrevious ABIPrevious TBI  +-------+-----------+-----------+------------+------------+  Right 1.06    0.85     1.07    0.75      +-------+-----------+-----------+------------+------------+  Left  0.62    0.49    1.05    0.89      +-------+-----------+-----------+------------+------------+        Right ABIs and TBIs appear essentially unchanged compared to prior study on 07/22/2017. Left ABIs and TBIs appear decreased compared to prior study on 07/22/2017.    Summary:  Right: Resting right ankle-brachial index is within normal range. No evidence of significant right lower extremity arterial disease. The right toe-brachial index is normal. RT great toe pressure = 124 mmHg.    Left: Resting left ankle-brachial index indicates moderate left lower extremity arterial disease. The left toe-brachial index is abnormal. LT Great toe pressure = 72 mmHg. PPG tracings appear dampened.      PLAN:  Goal: walk at least 30 minutes total daily.   Based on the patient's vascular studies and examination, and after discussing with Dr. Darrick Penna, pt will return to clinic in 6 months with left LE arterial duplex and ABI's.   I discussed in depth with the patient the nature of atherosclerosis, and emphasized the importance of maximal medical management including strict control of blood pressure, blood glucose, and lipid levels, obtaining regular exercise, and continued cessation of smoking.  The patient is aware that without maximal medical management the underlying atherosclerotic disease process will progress, limiting the benefit of any interventions.  The patient was given information about PAD including signs, symptoms, treatment, what symptoms should prompt the patient to seek immediate medical care, and risk reduction measures to take.  Charisse March, RN, MSN, FNP-C [facility] [place] Phone: [phone]  Clinic MD:  Fields  01/28/18 10:46 AM

## 2018-02-08 ENCOUNTER — Other Ambulatory Visit: Payer: Self-pay | Admitting: Family

## 2018-02-08 DIAGNOSIS — I739 Peripheral vascular disease, unspecified: Secondary | ICD-10-CM

## 2018-02-08 MED ORDER — CLOPIDOGREL BISULFATE 75 MG PO TABS: 75.0000 mg | ORAL_TABLET | Freq: Every day | ORAL | 3 refills | Status: DC

## 2018-02-25 ENCOUNTER — Other Ambulatory Visit: Payer: Self-pay | Admitting: *Deleted

## 2018-02-25 ENCOUNTER — Encounter: Payer: Self-pay | Admitting: *Deleted

## 2018-02-25 ENCOUNTER — Encounter: Payer: Self-pay | Admitting: Vascular Surgery

## 2018-02-25 ENCOUNTER — Ambulatory Visit: Payer: Medicare Other | Admitting: Family

## 2018-02-25 ENCOUNTER — Other Ambulatory Visit: Payer: Self-pay

## 2018-02-25 ENCOUNTER — Ambulatory Visit (INDEPENDENT_AMBULATORY_CARE_PROVIDER_SITE_OTHER): Payer: Medicare Other | Admitting: Vascular Surgery

## 2018-02-25 VITALS — BP 148/63 | HR 64 | Temp 97.4°F | Resp 20 | Ht 59.0 in | Wt 148.0 lb

## 2018-02-25 DIAGNOSIS — I739 Peripheral vascular disease, unspecified: Secondary | ICD-10-CM | POA: Diagnosis not present

## 2018-02-25 NOTE — Progress Notes (Signed)
Patient name: Pamela LovettRebecca Yaklin MRN: [id number] DOB: 03/20/1950 Sex: female  HPI: Pamela Perez is a 68 y.o. female, with a known history of a left superficial femoral artery occlusion.  She had a left SFA Viabahn stent placed by Dr. Claudie Fishermanhin in 2016.  This was done after a ground-level fall where the patient experienced a hematoma in her left thigh.  The stent was subsequently noted to be occluded December 2019.  At that time she was experiencing claudication symptoms.  She currently still experiences claudication symptoms in the left leg after walking about 1/4 mile.  Her symptoms resolved when she with rest.  Has no right leg symptoms.  Other medical problems include attention which is well controlled.  She is a non-smoker.  She did not have a history of elevated cholesterol.  She is currently on Plavix.  Past Medical History:  Diagnosis Date  . Hypertension    Past Surgical History:  Procedure Laterality Date  . APPLICATION OF WOUND VAC Left 07/20/2014   Procedure: APPLICATION OF WOUND VAC;  Surgeon: Fransisco HertzBrian L Chen, MD;  [facility];  Service: Vascular;  Laterality: Left;  . CHOLECYSTECTOMY    . I&D EXTREMITY Left 07/20/2014   Procedure: IRRIGATION AND DEBRIDEMENT EXTREMITY;  Surgeon: Fransisco HertzBrian L Chen, MD;  [facility];  Service: Vascular;  Laterality: Left;  . INSERTION OF ILIAC STENT Left 07/16/2014   Procedure: Left Leg Angiogram, Stenting of Left Superficial Femoral Artery, Repair of Pre-Patella Laceration, Evacuation of Left Thigh Hematoma;  Surgeon: Fransisco HertzBrian L Chen, MD;  [facility];  Service: Vascular;  Laterality: Left;  . PERIPHERAL VASCULAR CATHETERIZATION Left 11/14/2015   Procedure: Lower Extremity Angiography;  Surgeon: Maeola HarmanBrandon Christopher Cain, MD;  [facility];  Service: Cardiovascular;  Laterality: Left;  . PERIPHERAL VASCULAR CATHETERIZATION Left 11/14/2015   Procedure: Peripheral Vascular Atherectomy;  Surgeon: Maeola HarmanBrandon Christopher Cain, MD;  [facility];  Service: Cardiovascular;  Laterality: Left;    Family History  Problem Relation Age of Onset  . Arthritis Mother        Gout  . Kidney failure Mother   . Heart disease Father        Before age 67  . Hyperlipidemia Father   . Heart attack Father     SOCIAL HISTORY: Social History   Socioeconomic History  . Marital status: Married    Spouse name: Not on file  . Number of children: Not on file  . Years of education: Not on file  . Highest education level: Not on file  Occupational History  . Not on file  Social Needs  . Financial resource strain: Not on file  . Food insecurity:    Worry: Not on file    Inability: Not on file  . Transportation needs:    Medical: Not on file    Non-medical: Not on file  Tobacco Use  . Smoking status: Never Smoker  . Smokeless tobacco: Never Used  Substance and Sexual Activity  . Alcohol use: No    Alcohol/week: 0.0 standard drinks  . Drug use: No  . Sexual activity: Not on file  Lifestyle  . Physical activity:    Days per week: Not on file    Minutes per session: Not on file  . Stress: Not on file  Relationships  . Social connections:    Talks on phone: Not on file    Gets together: Not on file    Attends religious service: Not  on file    Active member of club or organization: Not on file    Attends meetings of clubs or organizations: Not on file    Relationship status: Not on file  . Intimate partner violence:    Fear of current or ex partner: Not on file    Emotionally abused: Not on file    Physically abused: Not on file    Forced sexual activity: Not on file  Other Topics Concern  . Not on file  Social History Narrative  . Not on file    No Known Allergies  Current Outpatient Medications  Medication Sig Dispense Refill  . clopidogrel (PLAVIX) 75 MG tablet Take 1 tablet (75 mg total) by mouth daily. 90 tablet 3  . lisinopril-hydrochlorothiazide (PRINZIDE,ZESTORETIC) 20-12.5 MG per tablet Take 0.5  tablets by mouth daily.    . metoprolol succinate (TOPROL-XL) 100 MG 24 hr tablet Take 100 mg by mouth daily.    . metroNIDAZOLE (METROCREAM) 0.75 % cream Apply 1 application topically daily.    Marland Kitchen GARLIC PO Take 1 tablet by mouth every other day.     No current facility-administered medications for this visit.     ROS:   General:  No weight loss, Fever, chills  HEENT: No recent headaches, no nasal bleeding, no visual changes, no sore throat  Neurologic: No dizziness, blackouts, seizures. No recent symptoms of stroke or mini- stroke. No recent episodes of slurred speech, or temporary blindness.  Cardiac: No recent episodes of chest pain/pressure, no shortness of breath at rest.  No shortness of breath with exertion.  Denies history of atrial fibrillation or irregular heartbeat  Vascular: No history of rest pain in feet.  No history of claudication.  No history of non-healing ulcer, No history of DVT   Pulmonary: No home oxygen, no productive cough, no hemoptysis,  No asthma or wheezing  Musculoskeletal:  [ ]  Arthritis, [ ]  Low back pain,  [ ]  Joint pain  Hematologic:No history of hypercoagulable state.  No history of easy bleeding.  No history of anemia  Gastrointestinal: No hematochezia or melena,  No gastroesophageal reflux, no trouble swallowing  Urinary: [ ]  chronic Kidney disease, [ ]  on HD - [ ]  MWF or [ ]  TTHS, [ ]  Burning with urination, [ ]  Frequent urination, [ ]  Difficulty urinating;   Skin: No rashes  Psychological: No history of anxiety,  No history of depression   Physical Examination  Vitals:   02/25/18 1407  BP: (!) 148/63  Pulse: 64  Resp: 20  Temp: (!) 97.4 F (36.3 C)  SpO2: 98%  Weight: 148 lb (67.1 kg)  Height: 4\' 11"  (1.499 m)    Body mass index is 29.89 kg/m.  General:  Alert and oriented, no acute distress HEENT: Normal Neck: No bruit or JVD Pulmonary: Clear to auscultation bilaterally Cardiac: Regular Rate and Rhythm without  murmur Abdomen: Soft, non-tender, non-distended, no mass Skin: No rash Extremity Pulses:  2+ radial, brachial, femoral, dorsalis pedis, absent left popliteal dorsalis pedis pulse absent posterior tibial pulses bilaterally Musculoskeletal: No deformity or edema  Neurologic: Upper and lower extremity motor 5/5 and symmetric  DATA:  I reviewed the patient's duplex exam and ABIs from December 2019.  ABI on the left was 0.62 right side was normal greater than 1 stent was occluded on duplex exam  ASSESSMENT: Patient with occlusion left superficial femoral artery stent she feels fairly disabled by her claudication symptoms and wishes to consider a bypass operation for improvement  of the symptoms.  Risk benefits possible complications and procedure details of arteriogram were discussed with the patient today.  These include but are not limited to bleeding infection vessel injury contrast reaction.  She understands and agrees to proceed.  Based on the angiogram findings we will then consider whether not she is a candidate for bypass operation.  PLAN: see above  Charles Fields, MD [facility] [place] Office: [phone] Pager: [phone]   

## 2018-02-25 NOTE — H&P (View-Only) (Signed)
Patient name: Pamela LovettRebecca Perez MRN: [id number] DOB: 03/20/1950 Sex: female  HPI: Pamela LovettRebecca Perez is a 68 y.o. female, with a known history of a left superficial femoral artery occlusion.  She had a left SFA Viabahn stent placed by Dr. Claudie Fishermanhin in 2016.  This was done after a ground-level fall where the patient experienced a hematoma in her left thigh.  The stent was subsequently noted to be occluded December 2019.  At that time she was experiencing claudication symptoms.  She currently still experiences claudication symptoms in the left leg after walking about 1/4 mile.  Her symptoms resolved when she with rest.  Has no right leg symptoms.  Other medical problems include attention which is well controlled.  She is a non-smoker.  She did not have a history of elevated cholesterol.  She is currently on Plavix.  Past Medical History:  Diagnosis Date  . Hypertension    Past Surgical History:  Procedure Laterality Date  . APPLICATION OF WOUND VAC Left 07/20/2014   Procedure: APPLICATION OF WOUND VAC;  Surgeon: Fransisco HertzBrian L Chen, MD;  [facility];  Service: Vascular;  Laterality: Left;  . CHOLECYSTECTOMY    . I&D EXTREMITY Left 07/20/2014   Procedure: IRRIGATION AND DEBRIDEMENT EXTREMITY;  Surgeon: Fransisco HertzBrian L Chen, MD;  [facility];  Service: Vascular;  Laterality: Left;  . INSERTION OF ILIAC STENT Left 07/16/2014   Procedure: Left Leg Angiogram, Stenting of Left Superficial Femoral Artery, Repair of Pre-Patella Laceration, Evacuation of Left Thigh Hematoma;  Surgeon: Fransisco HertzBrian L Chen, MD;  [facility];  Service: Vascular;  Laterality: Left;  . PERIPHERAL VASCULAR CATHETERIZATION Left 11/14/2015   Procedure: Lower Extremity Angiography;  Surgeon: Maeola HarmanBrandon Christopher Cain, MD;  [facility];  Service: Cardiovascular;  Laterality: Left;  . PERIPHERAL VASCULAR CATHETERIZATION Left 11/14/2015   Procedure: Peripheral Vascular Atherectomy;  Surgeon: Maeola HarmanBrandon Christopher Cain, MD;  [facility];  Service: Cardiovascular;  Laterality: Left;    Family History  Problem Relation Age of Onset  . Arthritis Mother        Gout  . Kidney failure Mother   . Heart disease Father        Before age 67  . Hyperlipidemia Father   . Heart attack Father     SOCIAL HISTORY: Social History   Socioeconomic History  . Marital status: Married    Spouse name: Not on file  . Number of children: Not on file  . Years of education: Not on file  . Highest education level: Not on file  Occupational History  . Not on file  Social Needs  . Financial resource strain: Not on file  . Food insecurity:    Worry: Not on file    Inability: Not on file  . Transportation needs:    Medical: Not on file    Non-medical: Not on file  Tobacco Use  . Smoking status: Never Smoker  . Smokeless tobacco: Never Used  Substance and Sexual Activity  . Alcohol use: No    Alcohol/week: 0.0 standard drinks  . Drug use: No  . Sexual activity: Not on file  Lifestyle  . Physical activity:    Days per week: Not on file    Minutes per session: Not on file  . Stress: Not on file  Relationships  . Social connections:    Talks on phone: Not on file    Gets together: Not on file    Attends religious service: Not  on file    Active member of club or organization: Not on file    Attends meetings of clubs or organizations: Not on file    Relationship status: Not on file  . Intimate partner violence:    Fear of current or ex partner: Not on file    Emotionally abused: Not on file    Physically abused: Not on file    Forced sexual activity: Not on file  Other Topics Concern  . Not on file  Social History Narrative  . Not on file    No Known Allergies  Current Outpatient Medications  Medication Sig Dispense Refill  . clopidogrel (PLAVIX) 75 MG tablet Take 1 tablet (75 mg total) by mouth daily. 90 tablet 3  . lisinopril-hydrochlorothiazide (PRINZIDE,ZESTORETIC) 20-12.5 MG per tablet Take 0.5  tablets by mouth daily.    . metoprolol succinate (TOPROL-XL) 100 MG 24 hr tablet Take 100 mg by mouth daily.    . metroNIDAZOLE (METROCREAM) 0.75 % cream Apply 1 application topically daily.    Marland Kitchen GARLIC PO Take 1 tablet by mouth every other day.     No current facility-administered medications for this visit.     ROS:   General:  No weight loss, Fever, chills  HEENT: No recent headaches, no nasal bleeding, no visual changes, no sore throat  Neurologic: No dizziness, blackouts, seizures. No recent symptoms of stroke or mini- stroke. No recent episodes of slurred speech, or temporary blindness.  Cardiac: No recent episodes of chest pain/pressure, no shortness of breath at rest.  No shortness of breath with exertion.  Denies history of atrial fibrillation or irregular heartbeat  Vascular: No history of rest pain in feet.  No history of claudication.  No history of non-healing ulcer, No history of DVT   Pulmonary: No home oxygen, no productive cough, no hemoptysis,  No asthma or wheezing  Musculoskeletal:  [ ]  Arthritis, [ ]  Low back pain,  [ ]  Joint pain  Hematologic:No history of hypercoagulable state.  No history of easy bleeding.  No history of anemia  Gastrointestinal: No hematochezia or melena,  No gastroesophageal reflux, no trouble swallowing  Urinary: [ ]  chronic Kidney disease, [ ]  on HD - [ ]  MWF or [ ]  TTHS, [ ]  Burning with urination, [ ]  Frequent urination, [ ]  Difficulty urinating;   Skin: No rashes  Psychological: No history of anxiety,  No history of depression   Physical Examination  Vitals:   02/25/18 1407  BP: (!) 148/63  Pulse: 64  Resp: 20  Temp: (!) 97.4 F (36.3 C)  SpO2: 98%  Weight: 148 lb (67.1 kg)  Height: 4\' 11"  (1.499 m)    Body mass index is 29.89 kg/m.  General:  Alert and oriented, no acute distress HEENT: Normal Neck: No bruit or JVD Pulmonary: Clear to auscultation bilaterally Cardiac: Regular Rate and Rhythm without  murmur Abdomen: Soft, non-tender, non-distended, no mass Skin: No rash Extremity Pulses:  2+ radial, brachial, femoral, dorsalis pedis, absent left popliteal dorsalis pedis pulse absent posterior tibial pulses bilaterally Musculoskeletal: No deformity or edema  Neurologic: Upper and lower extremity motor 5/5 and symmetric  DATA:  I reviewed the patient's duplex exam and ABIs from December 2019.  ABI on the left was 0.62 right side was normal greater than 1 stent was occluded on duplex exam  ASSESSMENT: Patient with occlusion left superficial femoral artery stent she feels fairly disabled by her claudication symptoms and wishes to consider a bypass operation for improvement  of the symptoms.  Risk benefits possible complications and procedure details of arteriogram were discussed with the patient today.  These include but are not limited to bleeding infection vessel injury contrast reaction.  She understands and agrees to proceed.  Based on the angiogram findings we will then consider whether not she is a candidate for bypass operation.  PLAN: see above  Fabienne Brunsharles Tevis Conger, MD [facility] [place] Office: [phone] Pager: [phone]

## 2018-03-12 ENCOUNTER — Other Ambulatory Visit: Payer: Self-pay

## 2018-03-12 ENCOUNTER — Ambulatory Visit (HOSPITAL_COMMUNITY)
Admission: RE | Admit: 2018-03-12 | Discharge: 2018-03-12 | Disposition: A | Discharge status: Home / Self Care | Payer: Medicare Other | Attending: Vascular Surgery | Admitting: Vascular Surgery

## 2018-03-12 ENCOUNTER — Encounter (HOSPITAL_COMMUNITY): Payer: Self-pay | Admitting: Vascular Surgery

## 2018-03-12 ENCOUNTER — Encounter (HOSPITAL_COMMUNITY)
Admission: RE | Disposition: A | Discharge status: Home / Self Care | Payer: Self-pay | Source: Home / Self Care | Attending: Vascular Surgery

## 2018-03-12 ENCOUNTER — Other Ambulatory Visit: Payer: Self-pay | Admitting: *Deleted

## 2018-03-12 ENCOUNTER — Ambulatory Visit (HOSPITAL_BASED_OUTPATIENT_CLINIC_OR_DEPARTMENT_OTHER): Payer: Medicare Other

## 2018-03-12 DIAGNOSIS — Z01810 Encounter for preprocedural cardiovascular examination: Secondary | ICD-10-CM | POA: Diagnosis not present

## 2018-03-12 DIAGNOSIS — Z7902 Long term (current) use of antithrombotics/antiplatelets: Secondary | ICD-10-CM | POA: Diagnosis not present

## 2018-03-12 DIAGNOSIS — Z79899 Other long term (current) drug therapy: Secondary | ICD-10-CM | POA: Insufficient documentation

## 2018-03-12 DIAGNOSIS — I739 Peripheral vascular disease, unspecified: Secondary | ICD-10-CM | POA: Diagnosis present

## 2018-03-12 DIAGNOSIS — I10 Essential (primary) hypertension: Secondary | ICD-10-CM | POA: Insufficient documentation

## 2018-03-12 DIAGNOSIS — I70212 Atherosclerosis of native arteries of extremities with intermittent claudication, left leg: Secondary | ICD-10-CM

## 2018-03-12 HISTORY — PX: ABDOMINAL AORTOGRAM W/LOWER EXTREMITY: CATH118223

## 2018-03-12 LAB — POCT I-STAT 4, (NA,K, GLUC, HGB,HCT)
Glucose, Bld: 130 mg/dL — ABNORMAL HIGH (ref 70–99)
HCT: 45 % (ref 36.0–46.0)
Hemoglobin: 15.3 g/dL — ABNORMAL HIGH (ref 12.0–15.0)
POTASSIUM: 3.7 mmol/L (ref 3.5–5.1)
Sodium: 138 mmol/L (ref 135–145)

## 2018-03-12 LAB — POCT I-STAT CREATININE: Creatinine, Ser: 0.8 mg/dL (ref 0.44–1.00)

## 2018-03-12 SURGERY — ABDOMINAL AORTOGRAM W/LOWER EXTREMITY
Anesthesia: LOCAL | Laterality: Bilateral

## 2018-03-12 MED ORDER — MIDAZOLAM HCL 2 MG/2ML IJ SOLN
INTRAMUSCULAR | Status: AC
  Filled 2018-03-12: qty 2

## 2018-03-12 MED ORDER — LIDOCAINE HCL (PF) 1 % IJ SOLN
INTRAMUSCULAR | Status: AC
  Filled 2018-03-12: qty 30

## 2018-03-12 MED ORDER — FENTANYL CITRATE (PF) 100 MCG/2ML IJ SOLN
INTRAMUSCULAR | Status: DC | PRN
  Administered 2018-03-12: 50 ug via INTRAVENOUS

## 2018-03-12 MED ORDER — ONDANSETRON HCL 4 MG/2ML IJ SOLN: 4.0000 mg | Freq: Four times a day (QID) | INTRAMUSCULAR | Status: DC | PRN

## 2018-03-12 MED ORDER — SODIUM CHLORIDE 0.9% FLUSH: 3.0000 mL | Freq: Two times a day (BID) | INTRAVENOUS | Status: DC

## 2018-03-12 MED ORDER — SODIUM CHLORIDE 0.9 % IV SOLN: INTRAVENOUS | Status: AC

## 2018-03-12 MED ORDER — IODIXANOL 320 MG/ML IV SOLN
INTRAVENOUS | Status: DC | PRN
  Administered 2018-03-12: 107 mL via INTRA_ARTERIAL

## 2018-03-12 MED ORDER — SODIUM CHLORIDE 0.9 % IV SOLN: 250.0000 mL | INTRAVENOUS | Status: DC | PRN

## 2018-03-12 MED ORDER — FENTANYL CITRATE (PF) 100 MCG/2ML IJ SOLN
INTRAMUSCULAR | Status: AC
  Filled 2018-03-12: qty 2

## 2018-03-12 MED ORDER — LABETALOL HCL 5 MG/ML IV SOLN: 10.0000 mg | INTRAVENOUS | Status: DC | PRN

## 2018-03-12 MED ORDER — HYDRALAZINE HCL 20 MG/ML IJ SOLN: 5.0000 mg | INTRAMUSCULAR | Status: DC | PRN

## 2018-03-12 MED ORDER — ACETAMINOPHEN 325 MG PO TABS: 650.0000 mg | ORAL_TABLET | ORAL | Status: DC | PRN

## 2018-03-12 MED ORDER — OXYCODONE HCL 5 MG PO TABS: 5.0000 mg | ORAL_TABLET | ORAL | Status: DC | PRN

## 2018-03-12 MED ORDER — MORPHINE SULFATE (PF) 10 MG/ML IV SOLN: 2.0000 mg | INTRAVENOUS | Status: DC | PRN

## 2018-03-12 MED ORDER — LIDOCAINE HCL (PF) 1 % IJ SOLN
INTRAMUSCULAR | Status: DC | PRN
  Administered 2018-03-12: 15 mL via INTRADERMAL

## 2018-03-12 MED ORDER — HEPARIN (PORCINE) IN NACL 1000-0.9 UT/500ML-% IV SOLN
INTRAVENOUS | Status: AC
  Filled 2018-03-12: qty 1000

## 2018-03-12 MED ORDER — SODIUM CHLORIDE 0.9% FLUSH: 3.0000 mL | INTRAVENOUS | Status: DC | PRN

## 2018-03-12 MED ORDER — MIDAZOLAM HCL 2 MG/2ML IJ SOLN
INTRAMUSCULAR | Status: DC | PRN
  Administered 2018-03-12: 1 mg via INTRAVENOUS

## 2018-03-12 MED ORDER — SODIUM CHLORIDE 0.9 % IV SOLN
INTRAVENOUS | Status: DC
  Administered 2018-03-12: 07:00:00 via INTRAVENOUS

## 2018-03-12 MED ORDER — HEPARIN (PORCINE) IN NACL 1000-0.9 UT/500ML-% IV SOLN
INTRAVENOUS | Status: DC | PRN
  Administered 2018-03-12 (×2): 500 mL

## 2018-03-12 SURGICAL SUPPLY — 8 items
CATH ANGIO 5F PIGTAIL 65CM (CATHETERS) ×2 IMPLANT
KIT PV (KITS) ×2 IMPLANT
SHEATH PINNACLE 5F 10CM (SHEATH) ×2 IMPLANT
SHEATH PROBE COVER 6X72 (BAG) ×2 IMPLANT
SYR MEDRAD MARK V 150ML (SYRINGE) ×2 IMPLANT
TRANSDUCER W/STOPCOCK (MISCELLANEOUS) ×2 IMPLANT
TRAY PV CATH (CUSTOM PROCEDURE TRAY) ×2 IMPLANT
WIRE HITORQ VERSACORE ST 145CM (WIRE) ×2 IMPLANT

## 2018-03-12 NOTE — Interval H&P Note (Signed)
History and Physical Interval Note:  03/12/2018 7:43 AM  Pamela Perez  has presented today for surgery, with the diagnosis of pvd  The various methods of treatment have been discussed with the patient and family. After consideration of risks, benefits and other options for treatment, the patient has consented to  Procedure(s): ABDOMINAL AORTOGRAM W/LOWER EXTREMITY (Bilateral) as a surgical intervention .  The patient's history has been reviewed, patient examined, no change in status, stable for surgery.  I have reviewed the patient's chart and labs.  Questions were answered to the patient's satisfaction.     Fabienne Bruns

## 2018-03-12 NOTE — Progress Notes (Signed)
Pt ambulatory in hall to bathroom, tolerated well, voided, right groin level zero, will continue with plans for D/C home per Dr Darrick Penna order

## 2018-03-12 NOTE — Op Note (Addendum)
Procedure: Abdominal aortogram with bilateral lower extremity runoff, ultrasound right groin  Preoperative diagnosis: Left leg claudication  Postoperative diagnosis: Same  Anesthesia: Local with IV sedation  Operative findings: #1 chronic occlusion left mid superficial femoral artery stent three-vessel left leg runoff after reconstitution of the above-knee popliteal artery  2.  No significant aortoiliac or right lower extremity arterial occlusive disease  Operative details: After obtaining informed consent, the patient was taken the PV lab.  Patient was placed in supine position on the angios table.  Both groins were prepped and draped in usual sterile fashion.  Local anesthesia was infiltrated over the right common femoral artery.  Ultrasound was used to identify the right common femoral artery and femoral bifurcation.  These were patent.  Image was obtained for the patient's medical record.  Next an introducer needle was used to cannulate the right common femoral artery and an 035 versa core wire threaded up in the abdominal aorta under fluoroscopic guidance.  A 5 French sheath was placed over the guidewire in the right common femoral artery.  This was thoroughly flushed with heparinized saline.  5 French pigtail catheter was then advanced up into the abdominal aorta and abdominal aortogram was obtained in AP projection.  Left and right renal arteries were widely patent.  Infrarenal abdominal aorta left and right common iliac external iliac and internal iliac arteries are all widely patent.  Next the pigtail catheter was pulled down just above the aortic bifurcation pelvic angiogram was obtained which shows again the infrarenal abdominal aorta is patent the inferior mesenteric artery is patent the left and right common external and internal iliac arteries are widely patent.  The common femoral arteries are patent bilaterally.  The profunda femoris arteries patent bilaterally.  Next bilateral lower  extremity runoff views were obtained through the pigtail catheter.  In the left lower extremity, the left common femoral and profunda femoris is patent.  The left superficial femoral artery is a very small vessel.  This occludes in the mid leg at the level of a pre-existing stent.  The above-knee popliteal artery then reconstitutes via profunda collaterals.  The remainder of the below-knee popliteal artery and all 3 tibial vessels are all widely patent.  These are all small.  In the right lower extremity, there is no significant occlusive disease the right common femoral superficial femoral profunda femoris popliteal artery and all 3 tibial vessels are widely patent.  At this point the pigtail catheter was removed over guidewire.  The 5 French sheath was left in the groin to be pulled in the holding area.  The patient tolerated the procedure well and there were no complications.  Operative management: The patient will be scheduled for a left femoral to above-knee popliteal bypass in the near future.  Fabienne Bruns, MD [facility] [place] Office: [phone] Pager: [phone]

## 2018-03-12 NOTE — Progress Notes (Signed)
Left lower extremity vein mapping completed, [employer], RVS 03/12/2018, 12:12 PM

## 2018-03-12 NOTE — Progress Notes (Signed)
Site area: Right groin a 5 french arterial sheath was removed  Site Prior to Removal:  Level 0  Pressure Applied For 20 MINUTES    Bedrest Beginning at 0850am  Manual:   Yes.    Patient Status During Pull:  stable  Post Pull Groin Site:  Level 0  Post Pull Instructions Given:  Yes.    Post Pull Pulses Present:  Yes.    Dressing Applied:  Yes.    Comments:  VS remain stable  

## 2018-03-12 NOTE — Discharge Instructions (Signed)
Femoral Site Care °This sheet gives you information about how to care for yourself after your procedure. Your health care provider may also give you more specific instructions. If you have problems or questions, contact your health care provider. °What can I expect after the procedure? °After the procedure, it is common to have: °· Bruising that usually fades within 1-2 weeks. °· Tenderness at the site. °Follow these instructions at home: °Wound care °· Follow instructions from your health care provider about how to take care of your insertion site. Make sure you: °? Wash your hands with soap and water before you change your bandage (dressing). If soap and water are not available, use hand sanitizer. °? Change your dressing as told by your health care provider. °? Leave stitches (sutures), skin glue, or adhesive strips in place. These skin closures may need to stay in place for 2 weeks or longer. If adhesive strip edges start to loosen and curl up, you may trim the loose edges. Do not remove adhesive strips completely unless your health care provider tells you to do that. °· Do not take baths, swim, or use a hot tub until your health care provider approves. °· You may shower 24-48 hours after the procedure or as told by your health care provider. °? Gently wash the site with plain soap and water. °? Pat the area dry with a clean towel. °? Do not rub the site. This may cause bleeding. °· Do not apply powder or lotion to the site. Keep the site clean and dry. °· Check your femoral site every day for signs of infection. Check for: °? Redness, swelling, or pain. °? Fluid or blood. °? Warmth. °? Pus or a bad smell. °Activity °· For the first 2-3 days after your procedure, or as long as directed: °? Avoid climbing stairs as much as possible. °? Do not squat. °· Do not lift anything that is heavier than 10 lb (4.5 kg), or the limit that you are told, until your health care provider says that it is safe. °· Rest as  directed. °? Avoid sitting for a long time without moving. Get up to take short walks every 1-2 hours. °· Do not drive for 24 hours if you were given a medicine to help you relax (sedative). °General instructions °· Take over-the-counter and prescription medicines only as told by your health care provider. °· Keep all follow-up visits as told by your health care provider. This is important. °Contact a health care provider if you have: °· A fever or chills. °· You have redness, swelling, or pain around your insertion site. °Get help right away if: °· The catheter insertion area swells very fast. °· You pass out. °· You suddenly start to sweat or your skin gets clammy. °· The catheter insertion area is bleeding, and the bleeding does not stop when you hold steady pressure on the area. °· The area near or just beyond the catheter insertion site becomes pale, cool, tingly, or numb. °These symptoms may represent a serious problem that is an emergency. Do not wait to see if the symptoms will go away. Get medical help right away. Call your local emergency services (911 in the [place] Do not drive yourself to the hospital. °Summary °· After the procedure, it is common to have bruising that usually fades within 1-2 weeks. °· Check your femoral site every day for signs of infection. °· Do not lift anything that is heavier than 10 lb (4.5 kg), or the   limit that you are told, until your health care provider says that it is safe. °This information is not intended to replace advice given to you by your health care provider. Make sure you discuss any questions you have with your health care provider. °Document Released: 10/07/2013 Document Revised: 02/16/2017 Document Reviewed: 02/16/2017 °Elsevier Interactive Patient Education © 2019 [employer] ° °

## 2018-03-15 ENCOUNTER — Telehealth: Payer: Self-pay | Admitting: *Deleted

## 2018-03-15 NOTE — Telephone Encounter (Signed)
Call to patient and instructed to be at [facility] admitting at 5:30 am on 04/05/2018 for surgery with Dr. Darrick Penna. NPO past MN night prior and to expect a call and follow the detailed instructions received from the hospital pre-admission department for this surgery. Patient is not on Plavix or Aspirin at present. Verbalized understanding . To call office if questions.

## 2018-03-26 ENCOUNTER — Encounter (HOSPITAL_COMMUNITY): Payer: Self-pay

## 2018-03-26 NOTE — Pre-Procedure Instructions (Signed)
Pamela Perez  03/26/2018      [id number] - [place], [place] - [address] [place] [address] Phone: [phone] Fax: [phone]    Your procedure is scheduled on 04-05-2008 Thursday   Report to [facility] Admitting at 5:30 A.M.   Call this number if you have problems the morning of surgery:  [phone]   Remember:  Do not eat food or drink liquids after midnight.                          Take these medicines the morning of surgery with A SIP OF WATER  Metoprolol(Lopressor)   STOP TAKING ANY ASPIRIN (UNLESS OTHERWISE INSTRUCTED BY YOUR SURGEON),ANTIINFLAMATORIES (IBUPROFEN,ALEVE,MOTRIN,ADVIL,GOODY'S POWDERS),HERBAL SUPPLEMENTS,FISH OIL,AND VITAMINS 5-7 DAYS PRIOR TO SURGERY   Follow your Surgeon's instructions on when to stop and resume Plavix      Do not wear jewelry, make-up or nail polish.  Do not wear lotions, powders, or perfumes, or deodorant.  Do not shave 48 hours prior to surgery.  .  Do not bring valuables to the hospital.  [facility] is not responsible for any belongings or valuables.  Contacts, dentures or bridgework may not be worn into surgery.  Leave your suitcase in the car.  After surgery it may be brought to your room.  For patients admitted to the hospital, discharge time will be determined by your treatment team.  Patients discharged the day of surgery will not be allowed to drive home.   [place] - Preparing for Surgery  Before surgery, you can play an important role.  Because skin is not sterile, your skin needs to be as free of germs as possible.  You can reduce the number of germs on you skin by washing with CHG (chlorahexidine gluconate) soap before surgery.  CHG is an antiseptic cleaner which kills germs and bonds with the skin to continue killing germs even after washing.  Oral Hygiene is also important in reducing the risk of infection.  Remember to brush your teeth  with your regular toothpaste the morning of surgery.  Please DO NOT use if you have an allergy to CHG or antibacterial soaps.  If your skin becomes reddened/irritated stop using the CHG and inform your nurse when you arrive at Short Stay.  Do not shave (including legs and underarms) for at least 48 hours prior to the first CHG shower.  You may shave your face.  Please follow these instructions carefully:   1.  Shower with CHG Soap the night before surgery and the morning of Surgery.  2.  If you choose to wash your hair, wash your hair first as usual with your normal shampoo.  3.  After you shampoo, rinse your hair and body thoroughly to remove the shampoo. 4.  Use CHG as you would any other liquid soap.  You can apply chg directly to the skin and wash gently with a      scrungie or washcloth.           5.  Apply the CHG Soap to your body ONLY FROM THE NECK DOWN.   Do not use on open wounds or open sores. Avoid contact with your eyes, ears, mouth and genitals (private parts).  Wash genitals (private parts) with your normal soap.  6.  Wash thoroughly, paying special attention to the area where your surgery will be performed.  7.  Thoroughly rinse your body with warm water from the neck down.  8.  DO NOT shower/wash with your normal soap after using and rinsing off the CHG Soap.  9.  Pat yourself dry with a clean towel.            10.  Wear clean pajamas.            11.  Place clean sheets on your bed the night of your first shower and do not sleep with pets.  Day of Surgery  Do not apply any lotions/deoderants the morning of surgery.   Please wear clean clothes to the hospital/surgery center. Remember to brush your teeth with toothpaste.     Please read over the following fact sheets that you were given. Pain Booklet, Coughing and Deep Breathing and Surgical Site Infection Prevention

## 2018-03-29 ENCOUNTER — Other Ambulatory Visit: Payer: Self-pay

## 2018-03-29 ENCOUNTER — Encounter (HOSPITAL_COMMUNITY)
Admission: RE | Admit: 2018-03-29 | Discharge: 2018-03-29 | Disposition: A | Discharge status: Home / Self Care | Payer: Medicare Other | Source: Ambulatory Visit | Attending: Vascular Surgery | Admitting: Vascular Surgery

## 2018-03-29 ENCOUNTER — Encounter (HOSPITAL_COMMUNITY): Payer: Self-pay

## 2018-03-29 DIAGNOSIS — Z01812 Encounter for preprocedural laboratory examination: Secondary | ICD-10-CM | POA: Insufficient documentation

## 2018-03-29 HISTORY — DX: Peripheral vascular disease, unspecified: I73.9

## 2018-03-29 HISTORY — DX: Family history of other specified conditions: Z84.89

## 2018-03-29 LAB — COMPREHENSIVE METABOLIC PANEL
ALT: 63 U/L — ABNORMAL HIGH (ref 0–44)
AST: 60 U/L — ABNORMAL HIGH (ref 15–41)
Albumin: 4 g/dL (ref 3.5–5.0)
Alkaline Phosphatase: 146 U/L — ABNORMAL HIGH (ref 38–126)
Anion gap: 7 (ref 5–15)
BUN: 13 mg/dL (ref 8–23)
CO2: 28 mmol/L (ref 22–32)
Calcium: 9.6 mg/dL (ref 8.9–10.3)
Chloride: 105 mmol/L (ref 98–111)
Creatinine, Ser: 0.83 mg/dL (ref 0.44–1.00)
GFR calc non Af Amer: >60 mL/min (ref >60)
Glucose, Bld: 130 mg/dL — ABNORMAL HIGH (ref 70–99)
POTASSIUM: 3.8 mmol/L (ref 3.5–5.1)
Sodium: 140 mmol/L (ref 135–145)
Total Bilirubin: 0.9 mg/dL (ref 0.3–1.2)
Total Protein: 8.4 g/dL — ABNORMAL HIGH (ref 6.5–8.1)

## 2018-03-29 LAB — PROTIME-INR
INR: 0.99
Prothrombin Time: 13 seconds (ref 11.4–15.2)

## 2018-03-29 LAB — URINALYSIS, ROUTINE W REFLEX MICROSCOPIC
Bacteria, UA: NONE SEEN
Bilirubin Urine: NEGATIVE
Glucose, UA: NEGATIVE mg/dL
Hgb urine dipstick: NEGATIVE
Ketones, ur: NEGATIVE mg/dL
Nitrite: NEGATIVE
Protein, ur: NEGATIVE mg/dL
Specific Gravity, Urine: 1.009 (ref 1.005–1.030)
pH: 6 (ref 5.0–8.0)

## 2018-03-29 LAB — CBC
HCT: 47.5 % — ABNORMAL HIGH (ref 36.0–46.0)
Hemoglobin: 15.2 g/dL — ABNORMAL HIGH (ref 12.0–15.0)
MCH: 29.5 pg (ref 26.0–34.0)
MCHC: 32 g/dL (ref 30.0–36.0)
MCV: 92.2 fL (ref 80.0–100.0)
Platelets: 277 10*3/uL (ref 150–400)
RBC: 5.15 MIL/uL — ABNORMAL HIGH (ref 3.87–5.11)
RDW: 12.6 % (ref 11.5–15.5)
WBC: 7.3 10*3/uL (ref 4.0–10.5)
nRBC: 0 % (ref 0.0–0.2)

## 2018-03-29 LAB — TYPE AND SCREEN
ABO/RH(D): AB POS
Antibody Screen: NEGATIVE

## 2018-03-29 LAB — SURGICAL PCR SCREEN
MRSA, PCR: NEGATIVE
Staphylococcus aureus: NEGATIVE

## 2018-03-29 LAB — APTT: aPTT: 35 seconds (ref 24–36)

## 2018-03-29 NOTE — Progress Notes (Signed)
Consent and posting stated "Right" for laterality, patient states left and Dr. Darrick Penna OP note state Left.  Spoke with Joyce Gross in the office, she is to put in new consent stating left as laterality and correct the posting.

## 2018-03-29 NOTE — Progress Notes (Signed)
PCP - Dr. Ronney Lion Cardiologist - patient denies  Chest x-ray - n/a EKG - 03/12/2018 Stress Test - patient denies ECHO - patient states it was over 10 years ago, not sure where or exactly when, and she has not had to follow up, negative study Cardiac Cath - patient denies  Sleep Study - patient denies CPAP -   Fasting Blood Sugar - n/a Checks Blood Sugar _____ times a day  Blood Thinner Instructions: last dose of plavix was 03/12/2018, patient states she was told to hold her plavix when she was here for her aortogram in January Aspirin Instructions: n/a  Anesthesia review: n/a  Patient denies shortness of breath, fever, cough and chest pain at PAT appointment   Patient verbalized understanding of instructions that were given to them at the PAT appointment. Patient was also instructed that they will need to review over the PAT instructions again at home before surgery.

## 2018-04-04 NOTE — Anesthesia Preprocedure Evaluation (Addendum)
Anesthesia Evaluation  Patient identified by MRN, date of birth, ID band Patient awake    Reviewed: Allergy & Precautions, NPO status , Patient's Chart, lab work & pertinent test results  History of Anesthesia Complications Negative for: history of anesthetic complications  Airway Mallampati: II  TM Distance: <3 FB Neck ROM: Full    Dental  (+) Teeth Intact, Dental Advisory Given   Pulmonary neg pulmonary ROS,    Pulmonary exam normal breath sounds clear to auscultation       Cardiovascular hypertension, Pt. on medications and Pt. on home beta blockers (-) angina+ Peripheral Vascular Disease (s/p iliac stent)  (-) CAD and (-) Past MI Normal cardiovascular exam Rhythm:Regular Rate:Normal     Neuro/Psych negative neurological ROS  negative psych ROS   GI/Hepatic negative GI ROS, Neg liver ROS,   Endo/Other  Obesity   Renal/GU negative Renal ROS     Musculoskeletal negative musculoskeletal ROS (+)   Abdominal   Peds  Hematology  (+) Blood dyscrasia (Plavix), ,   Anesthesia Other Findings Day of surgery medications reviewed with the patient.  Reproductive/Obstetrics                            Anesthesia Physical Anesthesia Plan  ASA: III  Anesthesia Plan: General   Post-op Pain Management:    Induction: Intravenous  PONV Risk Score and Plan: 3 and Midazolam, Dexamethasone and Ondansetron  Airway Management Planned: Oral ETT  Additional Equipment:   Intra-op Plan:   Post-operative Plan: Extubation in OR  Informed Consent: I have reviewed the patients History and Physical, chart, labs and discussed the procedure including the risks, benefits and alternatives for the proposed anesthesia with the patient or authorized representative who has indicated his/her understanding and acceptance.     Dental advisory given  Plan Discussed with: CRNA  Anesthesia Plan Comments:         Anesthesia Quick Evaluation

## 2018-04-05 ENCOUNTER — Encounter (HOSPITAL_COMMUNITY): Payer: Self-pay

## 2018-04-05 ENCOUNTER — Inpatient Hospital Stay (HOSPITAL_COMMUNITY): Payer: Medicare Other | Admitting: Anesthesiology

## 2018-04-05 ENCOUNTER — Encounter (HOSPITAL_COMMUNITY)
Admission: RE | Disposition: A | Discharge status: Home / Self Care | Payer: Self-pay | Source: Ambulatory Visit | Attending: Vascular Surgery

## 2018-04-05 ENCOUNTER — Inpatient Hospital Stay (HOSPITAL_COMMUNITY): Payer: Medicare Other

## 2018-04-05 ENCOUNTER — Inpatient Hospital Stay (HOSPITAL_COMMUNITY)
Admission: RE | Admit: 2018-04-05 | Discharge: 2018-04-08 | DRG: 254 | Disposition: A | Discharge status: Home / Self Care | Payer: Medicare Other | Source: Ambulatory Visit | Attending: Vascular Surgery | Admitting: Vascular Surgery

## 2018-04-05 ENCOUNTER — Other Ambulatory Visit: Payer: Self-pay

## 2018-04-05 DIAGNOSIS — I739 Peripheral vascular disease, unspecified: Secondary | ICD-10-CM | POA: Diagnosis present

## 2018-04-05 DIAGNOSIS — E669 Obesity, unspecified: Secondary | ICD-10-CM | POA: Diagnosis present

## 2018-04-05 DIAGNOSIS — I10 Essential (primary) hypertension: Secondary | ICD-10-CM | POA: Diagnosis present

## 2018-04-05 DIAGNOSIS — Z841 Family history of disorders of kidney and ureter: Secondary | ICD-10-CM | POA: Diagnosis not present

## 2018-04-05 DIAGNOSIS — Z8261 Family history of arthritis: Secondary | ICD-10-CM

## 2018-04-05 DIAGNOSIS — Z6831 Body mass index (BMI) 31.0-31.9, adult: Secondary | ICD-10-CM

## 2018-04-05 DIAGNOSIS — I70213 Atherosclerosis of native arteries of extremities with intermittent claudication, bilateral legs: Secondary | ICD-10-CM

## 2018-04-05 DIAGNOSIS — Z79899 Other long term (current) drug therapy: Secondary | ICD-10-CM

## 2018-04-05 DIAGNOSIS — T82868A Thrombosis of vascular prosthetic devices, implants and grafts, initial encounter: Principal | ICD-10-CM | POA: Diagnosis present

## 2018-04-05 DIAGNOSIS — Z8249 Family history of ischemic heart disease and other diseases of the circulatory system: Secondary | ICD-10-CM

## 2018-04-05 DIAGNOSIS — Z7902 Long term (current) use of antithrombotics/antiplatelets: Secondary | ICD-10-CM

## 2018-04-05 DIAGNOSIS — Z8349 Family history of other endocrine, nutritional and metabolic diseases: Secondary | ICD-10-CM | POA: Diagnosis not present

## 2018-04-05 DIAGNOSIS — Z9049 Acquired absence of other specified parts of digestive tract: Secondary | ICD-10-CM | POA: Diagnosis not present

## 2018-04-05 DIAGNOSIS — I70202 Unspecified atherosclerosis of native arteries of extremities, left leg: Secondary | ICD-10-CM | POA: Diagnosis present

## 2018-04-05 DIAGNOSIS — Z9889 Other specified postprocedural states: Secondary | ICD-10-CM | POA: Diagnosis not present

## 2018-04-05 DIAGNOSIS — D759 Disease of blood and blood-forming organs, unspecified: Secondary | ICD-10-CM | POA: Diagnosis present

## 2018-04-05 DIAGNOSIS — Z419 Encounter for procedure for purposes other than remedying health state, unspecified: Secondary | ICD-10-CM

## 2018-04-05 HISTORY — PX: FEMORAL-POPLITEAL BYPASS GRAFT: SHX937

## 2018-04-05 LAB — CBC
HEMATOCRIT: 40.3 % (ref 36.0–46.0)
Hemoglobin: 13.5 g/dL (ref 12.0–15.0)
MCH: 30.2 pg (ref 26.0–34.0)
MCHC: 33.5 g/dL (ref 30.0–36.0)
MCV: 90.2 fL (ref 80.0–100.0)
Platelets: 280 10*3/uL (ref 150–400)
RBC: 4.47 MIL/uL (ref 3.87–5.11)
RDW: 12.7 % (ref 11.5–15.5)
WBC: 21 10*3/uL — ABNORMAL HIGH (ref 4.0–10.5)
nRBC: 0 % (ref 0.0–0.2)

## 2018-04-05 LAB — CREATININE, SERUM
Creatinine, Ser: 0.87 mg/dL (ref 0.44–1.00)
GFR calc non Af Amer: >60 mL/min (ref >60)

## 2018-04-05 SURGERY — BYPASS GRAFT FEMORAL-POPLITEAL ARTERY
Anesthesia: General | Laterality: Left

## 2018-04-05 MED ORDER — SUGAMMADEX SODIUM 200 MG/2ML IV SOLN
INTRAVENOUS | Status: DC | PRN
  Administered 2018-04-05: 150 mg via INTRAVENOUS

## 2018-04-05 MED ORDER — ONDANSETRON HCL 4 MG/2ML IJ SOLN: 4.0000 mg | Freq: Four times a day (QID) | INTRAMUSCULAR | Status: DC | PRN

## 2018-04-05 MED ORDER — LISINOPRIL 10 MG PO TABS
20.0000 mg | ORAL_TABLET | Freq: Every day | ORAL | Status: DC
  Administered 2018-04-05 – 2018-04-07 (×2): 20 mg via ORAL
  Filled 2018-04-05 (×3): qty 2

## 2018-04-05 MED ORDER — PROMETHAZINE HCL 25 MG/ML IJ SOLN: 6.2500 mg | INTRAMUSCULAR | Status: DC | PRN

## 2018-04-05 MED ORDER — PROTAMINE SULFATE 10 MG/ML IV SOLN
INTRAVENOUS | Status: DC | PRN
  Administered 2018-04-05: 50 mg via INTRAVENOUS

## 2018-04-05 MED ORDER — HYDROCHLOROTHIAZIDE 25 MG PO TABS
25.0000 mg | ORAL_TABLET | Freq: Every day | ORAL | Status: DC
  Administered 2018-04-07: 25 mg via ORAL
  Filled 2018-04-05 (×3): qty 1

## 2018-04-05 MED ORDER — ACETAMINOPHEN 500 MG PO TABS
1000.0000 mg | ORAL_TABLET | Freq: Once | ORAL | Status: AC
  Administered 2018-04-05: 1000 mg via ORAL

## 2018-04-05 MED ORDER — LIDOCAINE 2% (20 MG/ML) 5 ML SYRINGE
INTRAMUSCULAR | Status: AC
  Filled 2018-04-05: qty 5

## 2018-04-05 MED ORDER — SUCCINYLCHOLINE CHLORIDE 20 MG/ML IJ SOLN
INTRAMUSCULAR | Status: DC | PRN
  Administered 2018-04-05: 100 mg via INTRAVENOUS

## 2018-04-05 MED ORDER — ROCURONIUM BROMIDE 50 MG/5ML IV SOSY
PREFILLED_SYRINGE | INTRAVENOUS | Status: AC
  Filled 2018-04-05: qty 5

## 2018-04-05 MED ORDER — CEFAZOLIN SODIUM-DEXTROSE 2-4 GM/100ML-% IV SOLN
2.0000 g | INTRAVENOUS | Status: AC
  Administered 2018-04-05: 2 g via INTRAVENOUS
  Filled 2018-04-05: qty 100

## 2018-04-05 MED ORDER — FENTANYL CITRATE (PF) 250 MCG/5ML IJ SOLN
INTRAMUSCULAR | Status: AC
  Filled 2018-04-05: qty 5

## 2018-04-05 MED ORDER — MIDAZOLAM HCL 5 MG/5ML IJ SOLN
INTRAMUSCULAR | Status: DC | PRN
  Administered 2018-04-05: 2 mg via INTRAVENOUS

## 2018-04-05 MED ORDER — ONDANSETRON HCL 4 MG/2ML IJ SOLN
INTRAMUSCULAR | Status: AC
  Filled 2018-04-05: qty 2

## 2018-04-05 MED ORDER — CHLORHEXIDINE GLUCONATE 4 % EX LIQD: 60.0000 mL | Freq: Once | CUTANEOUS | Status: DC

## 2018-04-05 MED ORDER — DOCUSATE SODIUM 100 MG PO CAPS
100.0000 mg | ORAL_CAPSULE | Freq: Every day | ORAL | Status: DC
  Administered 2018-04-07 – 2018-04-08 (×2): 100 mg via ORAL
  Filled 2018-04-05 (×3): qty 1

## 2018-04-05 MED ORDER — SODIUM CHLORIDE 0.9 % IV SOLN
INTRAVENOUS | Status: DC | PRN
  Administered 2018-04-05: 08:00:00

## 2018-04-05 MED ORDER — HYDRALAZINE HCL 20 MG/ML IJ SOLN: 5.0000 mg | INTRAMUSCULAR | Status: DC | PRN

## 2018-04-05 MED ORDER — ACETAMINOPHEN 325 MG RE SUPP: 325.0000 mg | RECTAL | Status: DC | PRN

## 2018-04-05 MED ORDER — GLYCOPYRROLATE PF 0.2 MG/ML IJ SOSY
PREFILLED_SYRINGE | INTRAMUSCULAR | Status: DC | PRN
  Administered 2018-04-05: 0.4 mg via INTRAVENOUS

## 2018-04-05 MED ORDER — ATROPINE SULFATE 0.4 MG/ML IJ SOLN
INTRAMUSCULAR | Status: DC | PRN
  Administered 2018-04-05 (×2): 0.2 mg via INTRAVENOUS

## 2018-04-05 MED ORDER — PROPOFOL 10 MG/ML IV BOLUS
INTRAVENOUS | Status: DC | PRN
  Administered 2018-04-05: 140 mg via INTRAVENOUS
  Administered 2018-04-05: 60 mg via INTRAVENOUS

## 2018-04-05 MED ORDER — LACTATED RINGERS IV SOLN
INTRAVENOUS | Status: DC | PRN
  Administered 2018-04-05 (×2): via INTRAVENOUS

## 2018-04-05 MED ORDER — HEPARIN SODIUM (PORCINE) 5000 UNIT/ML IJ SOLN
5000.0000 [IU] | Freq: Three times a day (TID) | INTRAMUSCULAR | Status: DC
  Administered 2018-04-05 – 2018-04-08 (×8): 5000 [IU] via SUBCUTANEOUS
  Filled 2018-04-05 (×8): qty 1

## 2018-04-05 MED ORDER — SODIUM CHLORIDE 0.9 % IV SOLN: 500.0000 mL | Freq: Once | INTRAVENOUS | Status: DC | PRN

## 2018-04-05 MED ORDER — POTASSIUM CHLORIDE CRYS ER 20 MEQ PO TBCR: 20.0000 meq | EXTENDED_RELEASE_TABLET | Freq: Every day | ORAL | Status: DC | PRN

## 2018-04-05 MED ORDER — PANTOPRAZOLE SODIUM 40 MG PO TBEC
40.0000 mg | DELAYED_RELEASE_TABLET | Freq: Every day | ORAL | Status: DC
  Administered 2018-04-05 – 2018-04-08 (×4): 40 mg via ORAL
  Filled 2018-04-05 (×4): qty 1

## 2018-04-05 MED ORDER — FENTANYL CITRATE (PF) 100 MCG/2ML IJ SOLN
INTRAMUSCULAR | Status: AC
  Administered 2018-04-05: 50 ug via INTRAVENOUS
  Filled 2018-04-05: qty 2

## 2018-04-05 MED ORDER — FENTANYL CITRATE (PF) 100 MCG/2ML IJ SOLN
25.0000 ug | INTRAMUSCULAR | Status: DC | PRN
  Administered 2018-04-05 (×2): 50 ug via INTRAVENOUS

## 2018-04-05 MED ORDER — SENNOSIDES-DOCUSATE SODIUM 8.6-50 MG PO TABS: 1.0000 | ORAL_TABLET | Freq: Every evening | ORAL | Status: DC | PRN

## 2018-04-05 MED ORDER — FENTANYL CITRATE (PF) 100 MCG/2ML IJ SOLN
INTRAMUSCULAR | Status: DC | PRN
  Administered 2018-04-05: 100 ug via INTRAVENOUS
  Administered 2018-04-05 (×3): 50 ug via INTRAVENOUS

## 2018-04-05 MED ORDER — PHENYLEPHRINE 40 MCG/ML (10ML) SYRINGE FOR IV PUSH (FOR BLOOD PRESSURE SUPPORT)
PREFILLED_SYRINGE | INTRAVENOUS | Status: AC
  Filled 2018-04-05: qty 10

## 2018-04-05 MED ORDER — MAGNESIUM SULFATE 2 GM/50ML IV SOLN: 2.0000 g | Freq: Every day | INTRAVENOUS | Status: DC | PRN

## 2018-04-05 MED ORDER — SODIUM CHLORIDE 0.9 % IV SOLN
INTRAVENOUS | Status: AC
  Filled 2018-04-05: qty 1.2

## 2018-04-05 MED ORDER — ACETAMINOPHEN 325 MG PO TABS
325.0000 mg | ORAL_TABLET | ORAL | Status: DC | PRN
  Administered 2018-04-06 – 2018-04-08 (×5): 650 mg via ORAL
  Filled 2018-04-05 (×5): qty 2

## 2018-04-05 MED ORDER — EPHEDRINE 5 MG/ML INJ
INTRAVENOUS | Status: AC
  Filled 2018-04-05: qty 10

## 2018-04-05 MED ORDER — LABETALOL HCL 5 MG/ML IV SOLN: 10.0000 mg | INTRAVENOUS | Status: DC | PRN

## 2018-04-05 MED ORDER — METOPROLOL SUCCINATE ER 100 MG PO TB24
100.0000 mg | ORAL_TABLET | Freq: Two times a day (BID) | ORAL | Status: DC
  Administered 2018-04-05 – 2018-04-07 (×2): 100 mg via ORAL
  Filled 2018-04-05 (×6): qty 1

## 2018-04-05 MED ORDER — ALUM & MAG HYDROXIDE-SIMETH 200-200-20 MG/5ML PO SUSP: 15.0000 mL | ORAL | Status: DC | PRN

## 2018-04-05 MED ORDER — HYDROMORPHONE HCL 1 MG/ML IJ SOLN
0.5000 mg | INTRAMUSCULAR | Status: DC | PRN
  Administered 2018-04-05 (×2): 1 mg via INTRAVENOUS
  Filled 2018-04-05 (×2): qty 1

## 2018-04-05 MED ORDER — BISACODYL 5 MG PO TBEC: 5.0000 mg | DELAYED_RELEASE_TABLET | Freq: Every day | ORAL | Status: DC | PRN

## 2018-04-05 MED ORDER — ASPIRIN EC 81 MG PO TBEC
81.0000 mg | DELAYED_RELEASE_TABLET | Freq: Every day | ORAL | Status: DC
  Administered 2018-04-05 – 2018-04-07 (×3): 81 mg via ORAL
  Filled 2018-04-05 (×3): qty 1

## 2018-04-05 MED ORDER — SODIUM CHLORIDE 0.9 % IV SOLN: INTRAVENOUS | Status: DC

## 2018-04-05 MED ORDER — 0.9 % SODIUM CHLORIDE (POUR BTL) OPTIME
TOPICAL | Status: DC | PRN
  Administered 2018-04-05: 2000 mL

## 2018-04-05 MED ORDER — EPHEDRINE SULFATE 50 MG/ML IJ SOLN
INTRAMUSCULAR | Status: DC | PRN
  Administered 2018-04-05: 10 mg via INTRAVENOUS
  Administered 2018-04-05 (×3): 5 mg via INTRAVENOUS
  Administered 2018-04-05: 10 mg via INTRAVENOUS
  Administered 2018-04-05 (×2): 5 mg via INTRAVENOUS

## 2018-04-05 MED ORDER — IODIXANOL 320 MG/ML IV SOLN
INTRAVENOUS | Status: DC | PRN
  Administered 2018-04-05: 50 mL via INTRAVENOUS

## 2018-04-05 MED ORDER — LIDOCAINE 2% (20 MG/ML) 5 ML SYRINGE
INTRAMUSCULAR | Status: DC | PRN
  Administered 2018-04-05: 80 mg via INTRAVENOUS

## 2018-04-05 MED ORDER — ONDANSETRON HCL 4 MG/2ML IJ SOLN
INTRAMUSCULAR | Status: DC | PRN
  Administered 2018-04-05: 4 mg via INTRAVENOUS

## 2018-04-05 MED ORDER — ROCURONIUM BROMIDE 50 MG/5ML IV SOSY
PREFILLED_SYRINGE | INTRAVENOUS | Status: DC | PRN
  Administered 2018-04-05: 50 mg via INTRAVENOUS

## 2018-04-05 MED ORDER — DEXAMETHASONE SODIUM PHOSPHATE 10 MG/ML IJ SOLN
INTRAMUSCULAR | Status: AC
  Filled 2018-04-05: qty 1

## 2018-04-05 MED ORDER — GUAIFENESIN-DM 100-10 MG/5ML PO SYRP: 15.0000 mL | ORAL_SOLUTION | ORAL | Status: DC | PRN

## 2018-04-05 MED ORDER — SODIUM CHLORIDE 0.9 % IV SOLN
INTRAVENOUS | Status: DC
  Administered 2018-04-05 – 2018-04-07 (×4): via INTRAVENOUS

## 2018-04-05 MED ORDER — DEXAMETHASONE SODIUM PHOSPHATE 10 MG/ML IJ SOLN
INTRAMUSCULAR | Status: DC | PRN
  Administered 2018-04-05: 5 mg via INTRAVENOUS

## 2018-04-05 MED ORDER — PHENOL 1.4 % MT LIQD: 1.0000 | OROMUCOSAL | Status: DC | PRN

## 2018-04-05 MED ORDER — SUCCINYLCHOLINE CHLORIDE 200 MG/10ML IV SOSY
PREFILLED_SYRINGE | INTRAVENOUS | Status: AC
  Filled 2018-04-05: qty 10

## 2018-04-05 MED ORDER — PHENYLEPHRINE HCL 10 MG/ML IJ SOLN
INTRAMUSCULAR | Status: DC | PRN
  Administered 2018-04-05 (×2): 40 ug via INTRAVENOUS
  Administered 2018-04-05: 80 ug via INTRAVENOUS

## 2018-04-05 MED ORDER — METOPROLOL TARTRATE 5 MG/5ML IV SOLN: 2.0000 mg | INTRAVENOUS | Status: DC | PRN

## 2018-04-05 MED ORDER — PROPOFOL 10 MG/ML IV BOLUS
INTRAVENOUS | Status: AC
  Filled 2018-04-05: qty 40

## 2018-04-05 MED ORDER — HEPARIN SODIUM (PORCINE) 1000 UNIT/ML IJ SOLN
INTRAMUSCULAR | Status: DC | PRN
  Administered 2018-04-05: 7000 [IU] via INTRAVENOUS

## 2018-04-05 MED ORDER — METRONIDAZOLE 0.75 % EX GEL
1.0000 "application " | Freq: Every day | CUTANEOUS | Status: DC
  Administered 2018-04-07: 1 via TOPICAL
  Filled 2018-04-05: qty 45

## 2018-04-05 MED ORDER — ACETAMINOPHEN 500 MG PO TABS
ORAL_TABLET | ORAL | Status: AC
  Administered 2018-04-05: 1000 mg via ORAL
  Filled 2018-04-05: qty 2

## 2018-04-05 MED ORDER — HEMOSTATIC AGENTS (NO CHARGE) OPTIME
TOPICAL | Status: DC | PRN
  Administered 2018-04-05: 1 via TOPICAL

## 2018-04-05 MED ORDER — GLYCOPYRROLATE PF 0.2 MG/ML IJ SOSY
PREFILLED_SYRINGE | INTRAMUSCULAR | Status: AC
  Filled 2018-04-05: qty 1

## 2018-04-05 MED ORDER — CEFAZOLIN SODIUM-DEXTROSE 2-4 GM/100ML-% IV SOLN
2.0000 g | Freq: Three times a day (TID) | INTRAVENOUS | Status: AC
  Administered 2018-04-05 (×2): 2 g via INTRAVENOUS
  Filled 2018-04-05 (×2): qty 100

## 2018-04-05 MED ORDER — MIDAZOLAM HCL 2 MG/2ML IJ SOLN
INTRAMUSCULAR | Status: AC
  Filled 2018-04-05: qty 2

## 2018-04-05 MED ORDER — OXYCODONE-ACETAMINOPHEN 5-325 MG PO TABS
1.0000 | ORAL_TABLET | ORAL | Status: DC | PRN
  Administered 2018-04-06: 2 via ORAL
  Filled 2018-04-05: qty 2

## 2018-04-05 MED ORDER — LISINOPRIL-HYDROCHLOROTHIAZIDE 20-25 MG PO TABS: 1.0000 | ORAL_TABLET | Freq: Every day | ORAL | Status: DC

## 2018-04-05 SURGICAL SUPPLY — 62 items
BANDAGE ESMARK 6X9 LF (GAUZE/BANDAGES/DRESSINGS) IMPLANT
BNDG ESMARK 6X9 LF (GAUZE/BANDAGES/DRESSINGS)
CANISTER SUCT 3000ML PPV (MISCELLANEOUS) ×3 IMPLANT
CANNULA VESSEL 3MM 2 BLNT TIP (CANNULA) ×9 IMPLANT
CLIP VESOCCLUDE MED 24/CT (CLIP) ×3 IMPLANT
CLIP VESOCCLUDE SM WIDE 24/CT (CLIP) ×3 IMPLANT
COVER WAND RF STERILE (DRAPES) ×3 IMPLANT
DERMABOND ADVANCED (GAUZE/BANDAGES/DRESSINGS) ×6
DERMABOND ADVANCED .7 DNX12 (GAUZE/BANDAGES/DRESSINGS) ×3 IMPLANT
DRAIN SNY WOU (WOUND CARE) IMPLANT
DRAPE X-RAY CASS 24X20 (DRAPES) ×3 IMPLANT
ELECT REM PT RETURN 9FT ADLT (ELECTROSURGICAL) ×3
ELECTRODE REM PT RTRN 9FT ADLT (ELECTROSURGICAL) ×1 IMPLANT
EVACUATOR SILICONE 100CC (DRAIN) IMPLANT
GAUZE 4X4 16PLY RFD (DISPOSABLE) ×3 IMPLANT
GLOVE BIO SURGEON STRL SZ 6.5 (GLOVE) ×6 IMPLANT
GLOVE BIO SURGEON STRL SZ7.5 (GLOVE) ×3 IMPLANT
GLOVE BIO SURGEONS STRL SZ 6.5 (GLOVE) ×3
GLOVE BIOGEL PI IND STRL 6.5 (GLOVE) ×4 IMPLANT
GLOVE BIOGEL PI IND STRL 7.0 (GLOVE) ×3 IMPLANT
GLOVE BIOGEL PI IND STRL 7.5 (GLOVE) ×1 IMPLANT
GLOVE BIOGEL PI IND STRL 8 (GLOVE) ×1 IMPLANT
GLOVE BIOGEL PI INDICATOR 6.5 (GLOVE) ×8
GLOVE BIOGEL PI INDICATOR 7.0 (GLOVE) ×6
GLOVE BIOGEL PI INDICATOR 7.5 (GLOVE) ×2
GLOVE BIOGEL PI INDICATOR 8 (GLOVE) ×2
GLOVE ECLIPSE 7.0 STRL STRAW (GLOVE) ×3 IMPLANT
GLOVE SURG SS PI 6.5 STRL IVOR (GLOVE) ×3 IMPLANT
GLOVE SURG SS PI 7.0 STRL IVOR (GLOVE) ×3 IMPLANT
GOWN STRL NON-REIN LRG LVL3 (GOWN DISPOSABLE) ×3 IMPLANT
GOWN STRL REUS W/ TWL LRG LVL3 (GOWN DISPOSABLE) ×6 IMPLANT
GOWN STRL REUS W/TWL LRG LVL3 (GOWN DISPOSABLE) ×12
GRAFT PROPATEN THIN WALL 6X80 (Vascular Products) ×3 IMPLANT
HEMOSTAT SPONGE AVITENE ULTRA (HEMOSTASIS) ×3 IMPLANT
KIT BASIN OR (CUSTOM PROCEDURE TRAY) ×3 IMPLANT
KIT TURNOVER KIT B (KITS) ×3 IMPLANT
MARKER GRAFT CORONARY BYPASS (MISCELLANEOUS) ×3 IMPLANT
NS IRRIG 1000ML POUR BTL (IV SOLUTION) ×6 IMPLANT
PACK PERIPHERAL VASCULAR (CUSTOM PROCEDURE TRAY) ×3 IMPLANT
PAD ARMBOARD 7.5X6 YLW CONV (MISCELLANEOUS) ×6 IMPLANT
SET COLLECT BLD 21X3/4 12 (NEEDLE) ×3 IMPLANT
STOPCOCK 4 WAY LG BORE MALE ST (IV SETS) ×3 IMPLANT
SUT PROLENE 5 0 C 1 24 (SUTURE) ×9 IMPLANT
SUT PROLENE 6 0 CC (SUTURE) ×18 IMPLANT
SUT PROLENE 7 0 BV 1 (SUTURE) IMPLANT
SUT PROLENE 7 0 BV1 MDA (SUTURE) IMPLANT
SUT SILK 2 0 SH (SUTURE) ×3 IMPLANT
SUT SILK 3 0 (SUTURE) ×6
SUT SILK 3-0 18XBRD TIE 12 (SUTURE) ×3 IMPLANT
SUT VIC AB 2-0 SH 27 (SUTURE) ×4
SUT VIC AB 2-0 SH 27XBRD (SUTURE) ×2 IMPLANT
SUT VIC AB 3-0 SH 27 (SUTURE) ×8
SUT VIC AB 3-0 SH 27X BRD (SUTURE) ×4 IMPLANT
SUT VIC AB 4-0 PS2 27 (SUTURE) ×12 IMPLANT
TAPE UMBILICAL COTTON 1/8X30 (MISCELLANEOUS) ×3 IMPLANT
TOWEL GREEN STERILE (TOWEL DISPOSABLE) ×3 IMPLANT
TRAY FOLEY MTR SLVR 16FR STAT (SET/KITS/TRAYS/PACK) ×3 IMPLANT
TUBE CONNECTING 12'X1/4 (SUCTIONS) ×1
TUBE CONNECTING 12X1/4 (SUCTIONS) ×2 IMPLANT
TUBING EXTENTION W/L.L. (IV SETS) ×3 IMPLANT
UNDERPAD 30X30 (UNDERPADS AND DIAPERS) ×3 IMPLANT
WATER STERILE IRR 1000ML POUR (IV SOLUTION) ×3 IMPLANT

## 2018-04-05 NOTE — Anesthesia Procedure Notes (Addendum)
Procedure Name: Intubation Date/Time: 04/05/2018 7:48 AM Performed by: Fransisca Kaufmann, CRNA Pre-anesthesia Checklist: Patient identified, Emergency Drugs available, Suction available and Patient being monitored Patient Re-evaluated:Patient Re-evaluated prior to induction Oxygen Delivery Method: Circle System Utilized Preoxygenation: Pre-oxygenation with 100% oxygen Induction Type: IV induction Ventilation: Mask ventilation without difficulty Laryngoscope Size: Glidescope and 4 Grade View: Grade III Tube type: Oral Tube size: 7.5 mm Number of attempts: 1 Airway Equipment and Method: Oral airway,  Video-laryngoscopy and Rigid stylet Placement Confirmation: ETT inserted through vocal cords under direct vision,  positive ETCO2 and breath sounds checked- equal and bilateral Secured at: 22 cm Tube secured with: Tape Dental Injury: Teeth and Oropharynx as per pre-operative assessment  Difficulty Due To: Difficulty was anticipated and Difficult Airway- due to anterior larynx

## 2018-04-05 NOTE — Progress Notes (Signed)
Pt awake in PACu  Vitals:   04/05/18 1350 04/05/18 1400 04/05/18 1420 04/05/18 1430  BP: 139/71  (!) 125/55   Pulse: 78 75 (!) 58   Resp: (!) 21 16 18    Temp:    98.3 F (36.8 C)  TempSrc:      SpO2: 98% 94% 96%   Weight:      Height:        Incisions no hematoma 2+ PT pulse   Stable post op awaiting 4e bed  Fabienne Bruns, MD [facility] Office: [phone] Pager: [phone]

## 2018-04-05 NOTE — Discharge Instructions (Signed)
 [facility]  Discharge instructions  Lower Extremity Bypass Surgery  Please refer to the following instruction for your post-procedure care. Your surgeon or physician assistant will discuss any changes with you.  Activity  You are encouraged to walk as much as you can. You can slowly return to normal activities during the month after your surgery. Avoid strenuous activity and heavy lifting until your doctor tells you it's OK. Avoid activities such as vacuuming or swinging a golf club. Do not drive until your doctor give the OK and you are no longer taking prescription pain medications. It is also normal to have difficulty with sleep habits, eating and bowel movement after surgery. These will go away with time.  Bathing/Showering  You may shower after you go home. Do not soak in a bathtub, hot tub, or swim until the incision heals completely.  Incision Care  Clean your incision with mild soap and water. Shower every day. Pat the area dry with a clean towel. You do not need a bandage unless otherwise instructed. Do not apply any ointments or creams to your incision. If you have open wounds you will be instructed how to care for them or a visiting nurse may be arranged for you. If you have staples or sutures along your incision they will be removed at your post-op appointment. You may have skin glue on your incision. Do not peel it off. It will come off on its own in about one week. If you have a great deal of moisture in your groin, use a gauze help keep this area dry.  Diet  Resume your normal diet. There are no special food restrictions following this procedure. A low fat/ low cholesterol diet is recommended for all patients with vascular disease. In order to heal from your surgery, it is CRITICAL to get adequate nutrition. Your body requires vitamins, minerals, and protein. Vegetables are the best source of vitamins and minerals. Vegetables also provide the  perfect balance of protein. Processed food has little nutritional value, so try to avoid this.  Medications  Resume taking all your medications unless your doctor or nurse practitioner tells you not to. If your incision is causing pain, you may take over-the-counter pain relievers such as acetaminophen (Tylenol). If you were prescribed a stronger pain medication, please aware these medication can cause nausea and constipation. Prevent nausea by taking the medication with a snack or meal. Avoid constipation by drinking plenty of fluids and eating foods with high amount of fiber, such as fruits, vegetables, and grains. Take Colase 100 mg (an over-the-counter stool softener) twice a day as needed for constipation. Do not take Tylenol if you are taking prescription pain medications.  Follow Up  Our office will schedule a follow up appointment 2-3 weeks following discharge.  Please call us immediately for any of the following conditions  Severe or worsening pain in your legs or feet while at rest or while walking Increase pain, redness, warmth, or drainage (pus) from your incision site(s) Fever of 101 degree or higher The swelling in your leg with the bypass suddenly worsens and becomes more painful than when you were in the hospital If you have been instructed to feel your graft pulse then you should do so every day. If you can no longer feel this pulse, call the office immediately. Not all patients are given this instruction.  Leg swelling is common after leg bypass surgery.  The swelling should improve over a few months   following surgery. To improve the swelling, you may elevate your legs above the level of your heart while you are sitting or resting. Your surgeon or physician assistant may ask you to apply an ACE wrap or wear compression (TED) stockings to help to reduce swelling.  Reduce your risk of vascular disease  Stop smoking. If you would like help call [facility] at 1-800-QUIT-NOW  (1-800-784-8669) or [place] at 336-586-4000.  Manage your cholesterol Maintain a desired weight Control your diabetes weight Control your diabetes Keep your blood pressure down  If you have any questions, please call the office at [phone]   

## 2018-04-05 NOTE — Progress Notes (Signed)
Patient arrived from PACU to 4E room 25 after left fem-pop bypass with  Dr. Darrick Penna.  Telemetry monitor applied and CCMD notified.  Patient oriented to unit and room to include call light and phone.  Will continue to monitor.

## 2018-04-05 NOTE — H&P (Signed)
Patient name: Pamela LovettRebecca Perez      MRN: [id number]        DOB: 09/20/1950            Sex: female  HPI: Pamela LovettRebecca Perez is a 68 y.o. female, with a known history of a left superficial femoral artery occlusion.  She had a left SFA Viabahn stent placed by Dr. Claudie Fishermanhin in 2016.  This was done after a ground-level fall where the patient experienced a hematoma in her left thigh.  The stent was subsequently noted to be occluded December 2019.  At that time she was experiencing claudication symptoms.  She currently still experiences claudication symptoms in the left leg after walking about 1/4 mile.  Her symptoms resolved when she with rest.  Has no right leg symptoms.  Other medical problems include attention which is well controlled.  She is a non-smoker.  She did not have a history of elevated cholesterol.  She is currently on Plavix.      Past Medical History:  Diagnosis Date  . Hypertension         Past Surgical History:  Procedure Laterality Date  . APPLICATION OF WOUND VAC Left 07/20/2014   Procedure: APPLICATION OF WOUND VAC;  Surgeon: Fransisco HertzBrian L Chen, MD;  [facility];  Service: Vascular;  Laterality: Left;  . CHOLECYSTECTOMY    . I&D EXTREMITY Left 07/20/2014   Procedure: IRRIGATION AND DEBRIDEMENT EXTREMITY;  Surgeon: Fransisco HertzBrian L Chen, MD;  [facility];  Service: Vascular;  Laterality: Left;  . INSERTION OF ILIAC STENT Left 07/16/2014   Procedure: Left Leg Angiogram, Stenting of Left Superficial Femoral Artery, Repair of Pre-Patella Laceration, Evacuation of Left Thigh Hematoma;  Surgeon: Fransisco HertzBrian L Chen, MD;  [facility];  Service: Vascular;  Laterality: Left;  . PERIPHERAL VASCULAR CATHETERIZATION Left 11/14/2015   Procedure: Lower Extremity Angiography;  Surgeon: Maeola HarmanBrandon Christopher Cain, MD;  [facility];  Service: Cardiovascular;  Laterality: Left;  . PERIPHERAL VASCULAR CATHETERIZATION Left 11/14/2015   Procedure: Peripheral Vascular Atherectomy;  Surgeon: Maeola HarmanBrandon  Christopher Cain, MD;  [facility];  Service: Cardiovascular;  Laterality: Left;         Family History  Problem Relation Age of Onset  . Arthritis Mother        Gout  . Kidney failure Mother   . Heart disease Father        Before age 67  . Hyperlipidemia Father   . Heart attack Father     SOCIAL HISTORY: Social History        Socioeconomic History  . Marital status: Married    Spouse name: Not on file  . Number of children: Not on file  . Years of education: Not on file  . Highest education level: Not on file  Occupational History  . Not on file  Social Needs  . Financial resource strain: Not on file  . Food insecurity:    Worry: Not on file    Inability: Not on file  . Transportation needs:    Medical: Not on file    Non-medical: Not on file  Tobacco Use  . Smoking status: Never Smoker  . Smokeless tobacco: Never Used  Substance and Sexual Activity  . Alcohol use: No    Alcohol/week: 0.0 standard drinks  . Drug use: No  . Sexual activity: Not on file  Lifestyle  . Physical activity:    Days per week: Not on file    Minutes per  session: Not on file  . Stress: Not on file  Relationships  . Social connections:    Talks on phone: Not on file    Gets together: Not on file    Attends religious service: Not on file    Active member of club or organization: Not on file    Attends meetings of clubs or organizations: Not on file    Relationship status: Not on file  . Intimate partner violence:    Fear of current or ex partner: Not on file    Emotionally abused: Not on file    Physically abused: Not on file    Forced sexual activity: Not on file  Other Topics Concern  . Not on file  Social History Narrative  . Not on file    No Known Allergies        Current Outpatient Medications  Medication Sig Dispense Refill  . clopidogrel (PLAVIX) 75 MG tablet Take 1 tablet (75 mg total) by mouth  daily. 90 tablet 3  . lisinopril-hydrochlorothiazide (PRINZIDE,ZESTORETIC) 20-12.5 MG per tablet Take 0.5 tablets by mouth daily.    . metoprolol succinate (TOPROL-XL) 100 MG 24 hr tablet Take 100 mg by mouth daily.    . metroNIDAZOLE (METROCREAM) 0.75 % cream Apply 1 application topically daily.    Marland Kitchen GARLIC PO Take 1 tablet by mouth every other day.     No current facility-administered medications for this visit.     ROS:   General:  No weight loss, Fever, chills  HEENT: No recent headaches, no nasal bleeding, no visual changes, no sore throat  Neurologic: No dizziness, blackouts, seizures. No recent symptoms of stroke or mini- stroke. No recent episodes of slurred speech, or temporary blindness.  Cardiac: No recent episodes of chest pain/pressure, no shortness of breath at rest.  No shortness of breath with exertion.  Denies history of atrial fibrillation or irregular heartbeat  Vascular: No history of rest pain in feet.  No history of claudication.  No history of non-healing ulcer, No history of DVT   Pulmonary: No home oxygen, no productive cough, no hemoptysis,  No asthma or wheezing  Musculoskeletal:  [ ]  Arthritis, [ ]  Low back pain,  [ ]  Joint pain  Hematologic:No history of hypercoagulable state.  No history of easy bleeding.  No history of anemia  Gastrointestinal: No hematochezia or melena,  No gastroesophageal reflux, no trouble swallowing  Urinary: [ ]  chronic Kidney disease, [ ]  on HD - [ ]  MWF or [ ]  TTHS, [ ]  Burning with urination, [ ]  Frequent urination, [ ]  Difficulty urinating;   Skin: No rashes  Psychological: No history of anxiety,  No history of depression   Physical Examination   Vitals:   04/05/18 0548  BP: (!) 160/67  Pulse: (!) 57  Resp: 18  Temp: 98 F (36.7 C)  TempSrc: Oral  SpO2: 98%  Weight: 65.8 kg  Height: 4\' 11"  (1.499 m)    General:  Alert and oriented, no acute distress HEENT: Normal Neck: No bruit  or JVD Pulmonary: Clear to auscultation bilaterally Cardiac: Regular Rate and Rhythm without murmur Abdomen: Soft, non-tender, non-distended, no mass Skin: No rash Extremity Pulses:  2+ radial, brachial, femoral, dorsalis pedis, absent left popliteal dorsalis pedis pulse absent posterior tibial pulses bilaterally Musculoskeletal: No deformity or edema      Neurologic: Upper and lower extremity motor 5/5 and symmetric  DATA:  I reviewed the patient's duplex exam and ABIs from December 2019.  ABI  on the left was 0.62 right side was normal greater than 1 stent was occluded on duplex exam  ASSESSMENT: Patient with occlusion left superficial femoral artery stent she feels fairly disabled by her claudication symptoms and wishes to consider a bypass operation for improvement of the symptoms.  Risk benefits possible complications and procedure details of bypass were discussed with the patient today.  These include but are not limited to bleeding infection graft thrombosis.  She understands and agrees to proceed.    PLAN: Left fem pop bypass today  Fabienne Bruns, MD [facility] Office: [phone] Pager: [phone]

## 2018-04-05 NOTE — Op Note (Signed)
Procedure: Left femoral to above-knee popliteal bypass with 6 mm propaten, intraoperative arteriogram  Preoperative diagnosis: Claudication  Postoperative diagnosis: Same  Anesthesia: General  Assistant: Lianne Cure, PA-C  Operative findings:   1. poor quality left greater saphenous vein 2 mm or less in portions  2.  Coronary marker placed at proximal anastomosis  Operative details: After pain informed consent, patient taken the operating.  The patient placed in supine position operating table.  After induction of general anesthesia and endotracheal intubation patient's entire left lower extremities prepped and draped in usual sterile fashion.  Longitudinal incision was made in the left groin carried out through subcutaneous tissues down the level of the left common femoral artery.  This had a good pulse within it.  It was dissected free circumferentially and a vessel loop placed around it.  Profunda femoris and superficial femoral arteries were also dissected free circumferentially and Vesseloops placed around these.  Next I proceeded to harvest the greater saphenous vein for the medial portion of the incision.  Several skip incisions were made down the thigh to assist in harvesting this.  The vein was of marginal quality with a trifurcation very early and only about 2 to 2-1/2 mm in diameter throughout most of its course.  The vein was set aside.  The incision on the medial aspect of the leg just above the knee was deepened and the sartorius muscle reflected posteriorly.  The popliteal space was entered and the above-knee popliteal artery dissected free circumferentially and Vesseloops placed around it.  The artery was soft in character.  I few geniculate branches were ligated and divided with silk ties to provide additional exposure.  Tunnel was created in the subsartorial space connecting the above-knee incision to the groin.  A 6 mm propatent PTFE was brought through this tunnel.  Patient  was given 9070 venous heparin.  Common femoral artery was controlled proximally with a Cooley clamp and distally with a vessel loop.  Longitudinal opening was made in the common femoral artery the graft was beveled and sewn endograft to side of artery using a running 6-0 Prolene suture.  At completion anastomosis everything was thoroughly flushed graft is clamped diet the anastomosis.  There was reasonable hemostasis and this was packed with Avitene.  Attention was then turned to the above-knee popliteal artery.  This was controlled proximally distally with fine below clamps.  A longitudinal opening was made in the above-knee popliteal artery.  Graft was cut to length spatulated and sewn endograft to side of artery using a running 6-0 Prolene suture.  Is prior to completion anastomosis it was for blood backbled and thoroughly flushed.  Anastomosis was secured clamps released there is pulsatile above-knee popliteal artery immediately.  There was a posterior tibial Doppler signal but the dorsalis pedis Doppler signal was difficult to hear.  Therefore aborted performed intraoperative arteriogram.  21-gauge butterfly needle was inserted into the proximal graft.  Inflow occlusion was performed the contrast angiogram showed a widely patent anastomosis with three-vessel runoff to the left foot.  The needle was removed the hole repaired with a single 6-0 Prolene suture.  A coronary marker was placed around the graft and sutured at the proximal anastomosis.  She was given 50 mg of protamine.  Hemostasis was obtained with Avitene.  All the Avitene was removed and everything was hemostatic.  All the incisions were then closed in multiple layers of 2-0 and 3-0 Vicryl 4-0 Vicryl subcuticular stitch in the skin and Dermabond was applied.  The patient tolerated procedure well there were no complications.  The instrument sponge and needle count was correct at the end of the case.  The patient was taken to recovery room in stable  condition.  Fabienne Bruns, MD [facility] Office: [phone] Pager: [phone]

## 2018-04-05 NOTE — Anesthesia Postprocedure Evaluation (Signed)
Anesthesia Post Note  Patient: Pamela Perez  Procedure(s) Performed: LEFT FEMORAL TO ABOVE THE KNEE POPLITEAL ARTERY BYPASS GRAFT USING X 80CM GORE PROPATEN GRAFT (Left )     Patient location during evaluation: PACU Anesthesia Type: General Level of consciousness: awake and alert Pain management: pain level controlled Vital Signs Assessment: post-procedure vital signs reviewed and stable Respiratory status: spontaneous breathing, nonlabored ventilation and respiratory function stable Cardiovascular status: blood pressure returned to baseline and stable Postop Assessment: no apparent nausea or vomiting Anesthetic complications: no    Last Vitals:  Vitals:   04/05/18 1513 04/05/18 1600  BP: 123/69 (!) 113/52  Pulse: 66   Resp:    Temp: 36.8 C   SpO2:      Last Pain:  Vitals:   04/05/18 1600  TempSrc:   PainSc: Asleep                 Cecile Hearing

## 2018-04-05 NOTE — Transfer of Care (Signed)
Immediate Anesthesia Transfer of Care Note  Patient: Pamela Perez  Procedure(s) Performed: LEFT FEMORAL TO ABOVE THE KNEE POPLITEAL ARTERY BYPASS GRAFT USING X 80CM GORE PROPATEN GRAFT (Left )  Patient Location: PACU  Anesthesia Type:General  Level of Consciousness: awake, alert , oriented and sedated  Airway & Oxygen Therapy: Patient Spontanous Breathing and Patient connected to nasal cannula oxygen  Post-op Assessment: Report given to RN, Post -op Vital signs reviewed and stable and Patient moving all extremities  Post vital signs: Reviewed and stable  Last Vitals:  Vitals Value Taken Time  BP 138/67 04/05/2018  1:33 PM  Temp    Pulse 78 04/05/2018  1:35 PM  Resp 22 04/05/2018  1:35 PM  SpO2 95 % 04/05/2018  1:35 PM  Vitals shown include unvalidated device data.  Last Pain:  Vitals:   04/05/18 0605  TempSrc:   PainSc: 0-No pain      Patients Stated Pain Goal: 3 (04/05/18 1655)  Complications: No apparent anesthesia complications

## 2018-04-06 ENCOUNTER — Inpatient Hospital Stay (HOSPITAL_COMMUNITY): Payer: Medicare Other

## 2018-04-06 ENCOUNTER — Encounter (HOSPITAL_COMMUNITY): Payer: Self-pay | Admitting: Vascular Surgery

## 2018-04-06 DIAGNOSIS — Z9889 Other specified postprocedural states: Secondary | ICD-10-CM

## 2018-04-06 LAB — BASIC METABOLIC PANEL
Anion gap: 9 (ref 5–15)
BUN: 16 mg/dL (ref 8–23)
CO2: 24 mmol/L (ref 22–32)
Calcium: 8.1 mg/dL — ABNORMAL LOW (ref 8.9–10.3)
Chloride: 103 mmol/L (ref 98–111)
Creatinine, Ser: 1.22 mg/dL — ABNORMAL HIGH (ref 0.44–1.00)
GFR calc Af Amer: 53 mL/min — ABNORMAL LOW (ref >60)
GFR calc non Af Amer: 46 mL/min — ABNORMAL LOW (ref >60)
Glucose, Bld: 132 mg/dL — ABNORMAL HIGH (ref 70–99)
POTASSIUM: 4.1 mmol/L (ref 3.5–5.1)
Sodium: 136 mmol/L (ref 135–145)

## 2018-04-06 LAB — CBC
HCT: 34.8 % — ABNORMAL LOW (ref 36.0–46.0)
Hemoglobin: 11 g/dL — ABNORMAL LOW (ref 12.0–15.0)
MCH: 29.2 pg (ref 26.0–34.0)
MCHC: 31.6 g/dL (ref 30.0–36.0)
MCV: 92.3 fL (ref 80.0–100.0)
Platelets: 256 10*3/uL (ref 150–400)
RBC: 3.77 MIL/uL — AB (ref 3.87–5.11)
RDW: 12.8 % (ref 11.5–15.5)
WBC: 14.5 10*3/uL — ABNORMAL HIGH (ref 4.0–10.5)
nRBC: 0 % (ref 0.0–0.2)

## 2018-04-06 NOTE — Progress Notes (Signed)
ABI's have been completed. Preliminary results can be found in CV Proc through chart review.   04/06/18 12:05 PM Olen Cordial RVT

## 2018-04-06 NOTE — Progress Notes (Signed)
Pt ambulated 230 ft in hallway using rolling walker. Pt tolerated well.

## 2018-04-06 NOTE — Progress Notes (Addendum)
[facility]  Subjective  - Doing pretty good.  No new complaints.   Objective (!) 102/44 60 97.7 F (36.5 C) (Oral) (!) 22 100%  Intake/Output Summary (Last 24 hours) at 04/06/2018 0722 Last data filed at 04/06/2018 0600 Gross per 24 hour  Intake 1520 ml  Output 1325 ml  Net 195 ml    Doppler DP/PT brisk left LE Incisions healing well.  Groin soft without hematoma dry guaze placed in groin crease Heart RRR Lungs non labored breathing  Assessment/Planning: POD #1 Left femoral to above-knee popliteal bypass with 6 mm propaten, intraoperative arteriogram  Patent blood flow with brisk doppler signals She has ambulated this am. Foley D/C this am pending independent voiding Poor PO intake no appetite Discharge planning when tolerating PO's, voiding and pain controlled. PT eval and treat    Mosetta Pigeon 04/06/2018 7:22 AM -- Bi almost triphasic PT doppler Biphasic DP Incisions clean no hematoma  Patent bypass  Walk today.  Home next 1-2 days as pain is controlled  Fabienne Bruns, MD [facility] [place] Office: [phone] Pager: [phone]  Laboratory Lab Results: Recent Labs    04/05/18 1540 04/06/18 0317  WBC 21.0* 14.5*  HGB 13.5 11.0*  HCT 40.3 34.8*  PLT 280 256   BMET Recent Labs    04/05/18 1540 04/06/18 0317  NA  --  136  K  --  4.1  CL  --  103  CO2  --  24  GLUCOSE  --  132*  BUN  --  16  CREATININE 0.87 1.22*  CALCIUM  --  8.1*    COAG Lab Results  Component Value Date   INR 0.99 03/29/2018   INR 1.13 07/16/2014   No results found for: PTT

## 2018-04-06 NOTE — Progress Notes (Signed)
Foley pulled at 0600. Pt due to void.

## 2018-04-06 NOTE — Evaluation (Signed)
Physical Therapy Evaluation Patient Details Name: Pamela Perez MRN: [id number] DOB: 1950/09/25 Today's Date: 04/06/2018   History of Present Illness  Pt is a 68 y/o female now s/p Left femoral to above-knee popliteal bypass on 2/17. PMHx includes HTN, PVD, hx of Cholecystectomy; Insertion of iliac stent (Left, 07/16/2014); Cardiac catheterization (Left, 11/14/2015)  Clinical Impression  Orders received for PT evaluation. Patient demonstrates deficits in functional mobility as indicated below. Will benefit from continued skilled PT to address deficits and maximize function. Will see as indicated and progress as tolerated.  Anticipate that patient will progress well and have necessary assist upon discharge. No post acute follow up recommended.    Follow Up Recommendations No PT follow up;Supervision for mobility/OOB    Equipment Recommendations  None recommended by PT    Recommendations for Other Services       Precautions / Restrictions Precautions Precautions: Fall Restrictions Weight Bearing Restrictions: No      Mobility  Bed Mobility Overal bed mobility: Needs Assistance Bed Mobility: Supine to Sit     Supine to sit: HOB elevated;Min assist     General bed mobility comments: assist for trunk elevation  Transfers Overall transfer level: Needs assistance Equipment used: Rolling walker (2 wheeled) Transfers: Sit to/from Stand Sit to Stand: Min assist         General transfer comment: VCs hand placement, assist to rise and steady at RW  Ambulation/Gait Ambulation/Gait assistance: Min guard Gait Distance (Feet): 260 Feet Assistive device: Rolling walker (2 wheeled) Gait Pattern/deviations: Step-through pattern;Antalgic;Decreased stride length Gait velocity: decreased Gait velocity interpretation: <1.8 ft/sec, indicate of risk for recurrent falls General Gait Details: patient with modest dizziness upon inital ambulation but improved with activity.   Stairs             Wheelchair Mobility    Modified Rankin (Stroke Patients Only)       Balance Overall balance assessment: Needs assistance Sitting-balance support: Feet supported Sitting balance-Leahy Scale: Good     Standing balance support: Bilateral upper extremity supported Standing balance-Leahy Scale: Poor                               Pertinent Vitals/Pain Pain Assessment: Faces Faces Pain Scale: Hurts even more Pain Location: LLE Pain Descriptors / Indicators: Discomfort;Sore Pain Intervention(s): Monitored during session;Limited activity within patient's tolerance;Repositioned    Home Living Family/patient expects to be discharged to:: Private residence Living Arrangements: Spouse/significant other Available Help at Discharge: Family;Available 24 hours/day Type of Home: House Home Access: Stairs to enter Entrance Stairs-Rails: None Entrance Stairs-Number of Steps: 1 step onto porch Home Layout: Two level;Able to live on main level with bedroom/bathroom Home Equipment: Dan Humphreys - 2 wheels;Bedside commode      Prior Function Level of Independence: Independent         Comments: working, driving     Hand Dominance   Dominant Hand: Right    Extremity/Trunk Assessment   Upper Extremity Assessment Upper Extremity Assessment: Generalized weakness    Lower Extremity Assessment Lower Extremity Assessment: Generalized weakness;LLE deficits/detail LLE: Unable to fully assess due to pain       Communication   Communication: No difficulties  Cognition Arousal/Alertness: Awake/alert Behavior During Therapy: WFL for tasks assessed/performed Overall Cognitive Status: Within Functional Limits for tasks assessed  General Comments General comments (skin integrity, edema, etc.): spouse present, BP monitored during session and improving with activity    Exercises     Assessment/Plan    PT  Assessment Patient needs continued PT services  PT Problem List Decreased strength;Decreased activity tolerance;Decreased balance;Decreased mobility;Pain       PT Treatment Interventions DME instruction;Stair training;Gait training;Functional mobility training;Therapeutic activities;Therapeutic exercise;Patient/family education;Balance training;Neuromuscular re-education    PT Goals (Current goals can be found in the Care Plan section)  Acute Rehab PT Goals Patient Stated Goal: regain independence  PT Goal Formulation: With patient/family Time For Goal Achievement: 04/20/18 Potential to Achieve Goals: Good    Frequency Min 3X/week   Barriers to discharge        Co-evaluation               AM-PAC PT "6 Clicks" Mobility  Outcome Measure Help needed turning from your back to your side while in a flat bed without using bedrails?: A Little Help needed moving from lying on your back to sitting on the side of a flat bed without using bedrails?: A Little Help needed moving to and from a bed to a chair (including a wheelchair)?: A Little Help needed standing up from a chair using your arms (e.g., wheelchair or bedside chair)?: A Little Help needed to walk in hospital room?: A Little Help needed climbing 3-5 steps with a railing? : A Lot 6 Click Score: 17    End of Session Equipment Utilized During Treatment: Gait belt Activity Tolerance: Patient tolerated treatment well Patient left: in chair;with call bell/phone within reach;with family/visitor present Nurse Communication: Mobility status PT Visit Diagnosis: Other abnormalities of gait and mobility (R26.89);Difficulty in walking, not elsewhere classified (R26.2)    Time: 8127-5170 PT Time Calculation (min) (ACUTE ONLY): 20 min   Charges:   PT Evaluation $PT Eval Moderate Complexity: 1 Mod          Charlotte Crumb, PT DPT  Board Certified Neurologic Specialist Acute Rehabilitation Services Pager [phone] Office  [phone]   Fabio Asa 04/06/2018, 11:07 AM

## 2018-04-06 NOTE — Progress Notes (Signed)
Pt ambulated in hall about 400 ft using front wheel walker on room air stand by assist. Pt states her left leg feels numb from not moving but slowly regained feeling with walking. Pt is somewhat weak and unsteady first time being up. Otherwise walk was tolerated well. Pt set up in bed with call bell in reach. Will continue to monitor.

## 2018-04-06 NOTE — Evaluation (Signed)
Occupational Therapy Evaluation Patient Details Name: Pamela Perez MRN: [id number] DOB: 1950/10/07 Today's Date: 04/06/2018    History of Present Illness Pt is a 68 y/o female now s/p Left femoral to above-knee popliteal bypass on 2/17. PMHx includes HTN, PVD, hx of Cholecystectomy; Insertion of iliac stent (Left, 07/16/2014); Cardiac catheterization (Left, 11/14/2015)   Clinical Impression   This 68 y/o female presents with the above. At baseline pt is independent with ADL, iADL and functional mobility. Pt presenting with generalized post-op pain and weakness, completing functional mobility using RW with minguard-minA; she currently requires setup-minguard assist for seated UB ADL, modA for LB ADL due to difficulty reaching towards LEs. Pt will return home with spouse who is able to assist with ADL/iADL needs. She will benefit form continued acute OT services to maximize her safety and independence with ADL and mobility prior to return home.   BP monitored this session and as follows:  Supine in bed start of session: 102/46 (61) Seated EOB: 114/52 (72) End of session and room/hallway level activity seated in recliner: 108/41 (63)     Follow Up Recommendations  Supervision/Assistance - 24 hour;No OT follow up    Equipment Recommendations  None recommended by OT           Precautions / Restrictions Precautions Precautions: Fall Restrictions Weight Bearing Restrictions: No      Mobility Bed Mobility Overal bed mobility: Needs Assistance Bed Mobility: Supine to Sit     Supine to sit: HOB elevated;Min assist     General bed mobility comments: assist for trunk elevation  Transfers Overall transfer level: Needs assistance Equipment used: Rolling walker (2 wheeled) Transfers: Sit to/from Stand Sit to Stand: Min assist         General transfer comment: VCs hand placement, assist to rise and steady at RW    Balance Overall balance assessment: Needs  assistance Sitting-balance support: Feet supported Sitting balance-Leahy Scale: Good     Standing balance support: Bilateral upper extremity supported Standing balance-Leahy Scale: Poor                             ADL either performed or assessed with clinical judgement   ADL Overall ADL's : Needs assistance/impaired Eating/Feeding: Independent;Sitting   Grooming: Set up;Sitting   Upper Body Bathing: Min guard;Set up;Sitting   Lower Body Bathing: Moderate assistance;Sit to/from stand   Upper Body Dressing : Set up;Min guard;Sitting   Lower Body Dressing: Moderate assistance;Sit to/from stand Lower Body Dressing Details (indicate cue type and reason): assist to don socks seated EOB; difficulty accessing LEs due to pain Toilet Transfer: Min guard;Ambulation;RW;Minimal assistance Toilet Transfer Details (indicate cue type and reason): minA to lower to toilet surface Toileting- Clothing Manipulation and Hygiene: Minimal assistance       Functional mobility during ADLs: Min guard;Rolling walker       Vision         Perception     Praxis      Pertinent Vitals/Pain Pain Assessment: Faces Faces Pain Scale: Hurts even more Pain Location: LLE Pain Descriptors / Indicators: Discomfort;Sore Pain Intervention(s): Monitored during session;Limited activity within patient's tolerance;Repositioned     Hand Dominance     Extremity/Trunk Assessment Upper Extremity Assessment Upper Extremity Assessment: Generalized weakness   Lower Extremity Assessment Lower Extremity Assessment: Defer to PT evaluation       Communication Communication Communication: No difficulties   Cognition Arousal/Alertness: Awake/alert Behavior During Therapy: [facility] for  tasks assessed/performed Overall Cognitive Status: Within Functional Limits for tasks assessed                                     General Comments  spouse present, BP monitored during session and  improving with activity    Exercises     Shoulder Instructions      Home Living Family/patient expects to be discharged to:: Private residence Living Arrangements: Spouse/significant other Available Help at Discharge: Family;Available 24 hours/day Type of Home: House Home Access: Stairs to enter [employer] of Steps: 1 step onto porch Entrance Stairs-Rails: None Home Layout: Two level;Able to live on main level with bedroom/bathroom     Bathroom Shower/Tub: Producer, television/film/video: Standard     Home Equipment: Environmental consultant - 2 wheels;Bedside commode          Prior Functioning/Environment Level of Independence: Independent        Comments: working, driving        OT Problem List: Decreased strength;Decreased range of motion;Decreased activity tolerance;Impaired balance (sitting and/or standing);Pain      OT Treatment/Interventions: Self-care/ADL training;Therapeutic exercise;DME and/or AE instruction;Therapeutic activities;Patient/family education;Balance training    OT Goals(Current goals can be found in the care plan section) Acute Rehab OT Goals Patient Stated Goal: regain independence  OT Goal Formulation: With patient Time For Goal Achievement: 04/20/18 Potential to Achieve Goals: Good  OT Frequency: Min 2X/week   Barriers to D/C:            Co-evaluation              AM-PAC OT "6 Clicks" Daily Activity     Outcome Measure Help from another person eating meals?: None Help from another person taking care of personal grooming?: A Little Help from another person toileting, which includes using toliet, bedpan, or urinal?: A Lot Help from another person bathing (including washing, rinsing, drying)?: A Lot Help from another person to put on and taking off regular upper body clothing?: A Little Help from another person to put on and taking off regular lower body clothing?: A Lot 6 Click Score: 16   End of Session Equipment Utilized  During Treatment: Gait belt;Rolling walker  Activity Tolerance: Patient tolerated treatment well Patient left: in chair;with call bell/phone within reach;with family/visitor present  OT Visit Diagnosis: Other abnormalities of gait and mobility (R26.89);Pain Pain - Right/Left: Left Pain - part of body: Leg                Time: 4098-1191 OT Time Calculation (min): 20 min Charges:  OT General Charges $OT Visit: 1 Visit OT Evaluation $OT Eval Moderate Complexity: 1 Mod  Marcy Siren, OT [employer] Pager [phone] Office [phone]   Orlando Penner 04/06/2018, 11:04 AM

## 2018-04-06 NOTE — Progress Notes (Signed)
BP low this morning SBP 84-95, DBP 38-42.  HR NSB in 50s.  Pt is drowsy and states she feels a bit weak and light headed, but no acute distress noted.  Vascular PA notified.  Holding a.m. metoprolol and HCTZ as per verbal orders. Will continue to monitor.

## 2018-04-07 LAB — CBC
HCT: 29.1 % — ABNORMAL LOW (ref 36.0–46.0)
Hemoglobin: 9.7 g/dL — ABNORMAL LOW (ref 12.0–15.0)
MCH: 30.8 pg (ref 26.0–34.0)
MCHC: 33.3 g/dL (ref 30.0–36.0)
MCV: 92.4 fL (ref 80.0–100.0)
Platelets: 181 10*3/uL (ref 150–400)
RBC: 3.15 MIL/uL — ABNORMAL LOW (ref 3.87–5.11)
RDW: 13.2 % (ref 11.5–15.5)
WBC: 9.8 10*3/uL (ref 4.0–10.5)
nRBC: 0 % (ref 0.0–0.2)

## 2018-04-07 LAB — BASIC METABOLIC PANEL
Anion gap: 8 (ref 5–15)
BUN: 16 mg/dL (ref 8–23)
CO2: 21 mmol/L — ABNORMAL LOW (ref 22–32)
Calcium: 8.1 mg/dL — ABNORMAL LOW (ref 8.9–10.3)
Chloride: 108 mmol/L (ref 98–111)
Creatinine, Ser: 0.9 mg/dL (ref 0.44–1.00)
GFR calc Af Amer: >60 mL/min (ref >60)
GFR calc non Af Amer: >60 mL/min (ref >60)
Glucose, Bld: 130 mg/dL — ABNORMAL HIGH (ref 70–99)
Potassium: 3.4 mmol/L — ABNORMAL LOW (ref 3.5–5.1)
Sodium: 137 mmol/L (ref 135–145)

## 2018-04-07 NOTE — Progress Notes (Signed)
Physical Therapy Treatment Patient Details Name: Pamela Perez MRN: [id number] DOB: [birth year] Today's Date: 04/07/2018    History of Present Illness Pt is a 68 y/o female now s/p Left femoral to above-knee popliteal bypass on 2/17. PMHx includes HTN, PVD, hx of Cholecystectomy; Insertion of iliac stent (Left, 07/16/2014); Cardiac catheterization (Left, 11/14/2015)    PT Comments    Pt did well walking in halls with RW. We talked about how to progress her mobilty once home. She is doing well.  O2 sats stayed 96% on room air after walking.  Pt had minimal drainage from incisions - nursing notified. Pt hoping to progress to home tomorrow   Follow Up Recommendations  No PT follow up;Supervision for mobility/OOB     Equipment Recommendations  None recommended by PT    Recommendations for Other Services       Precautions / Restrictions Precautions Precautions: Fall Restrictions Weight Bearing Restrictions: No Other Position/Activity Restrictions: pt educated in safe elevation of leg and positioning    Mobility  Bed Mobility               General bed mobility comments: pt OOB in chair at beginning of session  Transfers Overall transfer level: Needs assistance Equipment used: Rolling walker (2 wheeled) Transfers: Sit to/from Stand Sit to Stand: Min guard         General transfer comment: pt needed cues for hand placement and not pulling up from RW  Ambulation/Gait Ambulation/Gait assistance: Min guard Gait Distance (Feet): 320 Feet Assistive device: Rolling walker (2 wheeled) Gait Pattern/deviations: Antalgic;Decreased stride length;Step-to pattern     General Gait Details: pt working on standing up taller when walking.  Pt doing a step to gait pattern - educated her on progressing to step through.  educated her on getting on a walking program.  pt able to verbalize going up and down her step to get into her house.   Stairs             Wheelchair  Mobility    Modified Rankin (Stroke Patients Only)       Balance                                            Cognition Arousal/Alertness: Awake/alert Behavior During Therapy: WFL for tasks assessed/performed Overall Cognitive Status: Within Functional Limits for tasks assessed                                        Exercises Total Joint Exercises Ankle Circles/Pumps: AROM;15 reps;Both    General Comments General comments (skin integrity, edema, etc.): pt did well walking.  she has RW at home and knows this is important in her recovery.  we talked about waht to expect and how to progress at home.  Her O2 sats remained 96% after walking on RA.  Pt hoping to go home tomorrow      Pertinent Vitals/Pain Pain Assessment: 0-10 Pain Score: 5  Pain Descriptors / Indicators: Discomfort;Sore Pain Intervention(s): Monitored during session;Repositioned;Limited activity within patient's tolerance    Home Living                      Prior Function            PT Goals (current  goals can now be found in the care plan section) Progress towards PT goals: Progressing toward goals    Frequency    Min 3X/week      PT Plan Current plan remains appropriate    Co-evaluation              AM-PAC PT "6 Clicks" Mobility   Outcome Measure  Help needed turning from your back to your side while in a flat bed without using bedrails?: A Little Help needed moving from lying on your back to sitting on the side of a flat bed without using bedrails?: A Little Help needed moving to and from a bed to a chair (including a wheelchair)?: A Little Help needed standing up from a chair using your arms (e.g., wheelchair or bedside chair)?: A Little Help needed to walk in hospital room?: A Little Help needed climbing 3-5 steps with a railing? : A Lot 6 Click Score: 17    End of Session   Activity Tolerance: Patient tolerated treatment well Patient  left: in chair;with call bell/phone within reach;with family/visitor present Nurse Communication: Mobility status PT Visit Diagnosis: Other abnormalities of gait and mobility (R26.89);Difficulty in walking, not elsewhere classified (R26.2)     Time: 0539-7673 PT Time Calculation (min) (ACUTE ONLY): 20 min  Charges:  $Gait Training: 8-22 mins                     04/07/2018   Ranae Palms, PT    Judson Roch 04/07/2018, 11:33 AM

## 2018-04-07 NOTE — Progress Notes (Signed)
The pt's BP was 97/31 this evening. Decided to hold off evening Lisinopril and TOPROL-XL medicines.

## 2018-04-07 NOTE — Progress Notes (Addendum)
[facility]  Subjective  - Doing OK but still feeling puny.   Objective (!) 124/56 75 98.3 F (36.8 C) (Oral) 17 98%  Intake/Output Summary (Last 24 hours) at 04/07/2018 0715 Last data filed at 04/07/2018 0400 Gross per 24 hour  Intake 3956.81 ml  Output -  Net 3956.81 ml    Palpable left DP doppler PT Left LE incision healing well, dry guaze in groin crease Ambulating in the halls with rolling walker Tolerating PO, little appetite Heart RRR Lungs non labored breathing   Assessment/Planning: POD # 2 Left femoral to above-knee popliteal bypass with 6 mm propaten,intraoperative arteriogram  Patent left arterial flow with palpable DP She is ambulating with rolling walker which she has at home No HH needed Pending pain control and mobility she will probably be discharged tomorrow.   Mosetta Pigeon 04/07/2018 7:15 AM -- Palpable pedal pulses incisions healing Ambulate today Most likely d/c am  Fabienne Bruns, MD [facility] Office: [phone] Pager: [phone]  Laboratory Lab Results: Recent Labs    04/06/18 0317 04/07/18 0258  WBC 14.5* 9.8  HGB 11.0* 9.7*  HCT 34.8* 29.1*  PLT 256 181   BMET Recent Labs    04/06/18 0317 04/07/18 0258  NA 136 137  K 4.1 3.4*  CL 103 108  CO2 24 21*  GLUCOSE 132* 130*  BUN 16 16  CREATININE 1.22* 0.90  CALCIUM 8.1* 8.1*    COAG Lab Results  Component Value Date   INR 0.99 03/29/2018   INR 1.13 07/16/2014   No results found for: PTT

## 2018-04-07 NOTE — Progress Notes (Signed)
Occupational Therapy Treatment Patient Details Name: Pamela Perez MRN: [id number] DOB: [birth year] Today's Date: 04/07/2018    History of present illness Pt is a 68 y/o female now s/p Left femoral to above-knee popliteal bypass on 2/17. PMHx includes HTN, PVD, hx of Cholecystectomy; Insertion of iliac stent (Left, 07/16/2014); Cardiac catheterization (Left, 11/14/2015)   OT comments  Pt education performed on energy conservation and LB AE. Pt walking in halls with staff today using RW for stability. Pt continues to have difficulty dressing LLE, but plans to wear slip in shoes for ease and supportive spouse. Pt continues to progress and supervision level for ADL at this time. Pt does not require continued OT skilled services. OT signing off.    Follow Up Recommendations  Supervision - Intermittent;No OT follow up    Equipment Recommendations  None recommended by OT    Recommendations for Other Services      Precautions / Restrictions Precautions Precautions: Fall Restrictions Weight Bearing Restrictions: No       Mobility Bed Mobility               General bed mobility comments: pt OOB in chair at beginning of session  Transfers Overall transfer level: Needs assistance Equipment used: Rolling walker (2 wheeled) Transfers: Sit to/from Stand Sit to Stand: Supervision              Balance Overall balance assessment: Mild deficits observed, not formally tested   Sitting balance-Leahy Scale: Good       Standing balance-Leahy Scale: Fair                             ADL either performed or assessed with clinical judgement   ADL Overall ADL's : Needs assistance/impaired                         Toilet Transfer: Supervision/safety;RW   Toileting- Architect and Hygiene: Supervision/safety;Sit to/from stand       Functional mobility during ADLs: Supervision/safety;Rolling walker General ADL Comments: Pt performing LB ADL  with figure four on RLE and assist required for LLE, but pt plans to wear slip on shoes at home.     Vision   Vision Assessment?: No apparent visual deficits   Perception     Praxis      Cognition Arousal/Alertness: Awake/alert Behavior During Therapy: WFL for tasks assessed/performed Overall Cognitive Status: Within Functional Limits for tasks assessed                                          Exercises     Shoulder Instructions       General Comments Energy conservation education provided and LB AE education provided, but pt was not interested    Pertinent Vitals/ Pain       Pain Assessment: No/denies pain  Home Living                                          Prior Functioning/Environment              Frequency  Min 2X/week        Progress Toward Goals  OT Goals(current goals can now be found in the care  plan section)  Progress towards OT goals: Progressing toward goals  Acute Rehab OT Goals Patient Stated Goal: regain independence  OT Goal Formulation: With patient Time For Goal Achievement: 04/20/18 Potential to Achieve Goals: Good ADL Goals Pt Will Perform Grooming: with modified independence;standing Pt Will Perform Lower Body Dressing: with min guard assist;sit to/from stand Pt Will Transfer to Toilet: with modified independence;ambulating Pt Will Perform Toileting - Clothing Manipulation and hygiene: with modified independence;sit to/from stand  Plan Discharge plan remains appropriate    Co-evaluation                 AM-PAC OT "6 Clicks" Daily Activity     Outcome Measure   Help from another person eating meals?: None Help from another person taking care of personal grooming?: None Help from another person toileting, which includes using toliet, bedpan, or urinal?: A Little Help from another person bathing (including washing, rinsing, drying)?: A Little Help from another person to put on and  taking off regular upper body clothing?: A Little Help from another person to put on and taking off regular lower body clothing?: A Little 6 Click Score: 20    End of Session Equipment Utilized During Treatment: Rolling walker  OT Visit Diagnosis: Other abnormalities of gait and mobility (R26.89)   Activity Tolerance Patient tolerated treatment well   Patient Left in chair;with call bell/phone within reach   Nurse Communication Mobility status        Time: 1420-1436 OT Time Calculation (min): 16 min  Charges: OT General Charges $OT Visit: 1 Visit OT Treatments $Self Care/Home Management : 8-22 mins  Cristi Loron) Glendell Docker OTR/L Acute Rehabilitation Services Pager: [phone] Office: [phone]    Sandrea Hughs 04/07/2018, 4:51 PM

## 2018-04-08 LAB — CBC
HEMATOCRIT: 29.3 % — AB (ref 36.0–46.0)
HEMOGLOBIN: 9.3 g/dL — AB (ref 12.0–15.0)
MCH: 29.1 pg (ref 26.0–34.0)
MCHC: 31.7 g/dL (ref 30.0–36.0)
MCV: 91.6 fL (ref 80.0–100.0)
Platelets: 204 10*3/uL (ref 150–400)
RBC: 3.2 MIL/uL — AB (ref 3.87–5.11)
RDW: 12.8 % (ref 11.5–15.5)
WBC: 10.9 10*3/uL — ABNORMAL HIGH (ref 4.0–10.5)
nRBC: 0 % (ref 0.0–0.2)

## 2018-04-08 LAB — BASIC METABOLIC PANEL
Anion gap: 9 (ref 5–15)
BUN: 7 mg/dL — ABNORMAL LOW (ref 8–23)
CHLORIDE: 105 mmol/L (ref 98–111)
CO2: 24 mmol/L (ref 22–32)
Calcium: 8.6 mg/dL — ABNORMAL LOW (ref 8.9–10.3)
Creatinine, Ser: 0.72 mg/dL (ref 0.44–1.00)
GFR calc Af Amer: >60 mL/min (ref >60)
GFR calc non Af Amer: >60 mL/min (ref >60)
Glucose, Bld: 128 mg/dL — ABNORMAL HIGH (ref 70–99)
POTASSIUM: 3.4 mmol/L — AB (ref 3.5–5.1)
Sodium: 138 mmol/L (ref 135–145)

## 2018-04-08 MED ORDER — OXYCODONE-ACETAMINOPHEN 5-325 MG PO TABS: 1.0000 | ORAL_TABLET | ORAL | 0 refills | Status: DC | PRN

## 2018-04-08 MED ORDER — ASPIRIN 325 MG PO TABS
325.0000 mg | ORAL_TABLET | Freq: Every day | ORAL | Status: DC
  Administered 2018-04-08: 325 mg via ORAL
  Filled 2018-04-08: qty 1

## 2018-04-08 MED FILL — OXYCODONE W/APAP 5/325 TAB: 5-325 | 4 days supply | Qty: 30 | Fill #0

## 2018-04-08 NOTE — Progress Notes (Addendum)
[facility] [place]  Subjective  - Doing very well.   Objective (!) 141/52 69 98.7 F (37.1 C) (Oral) 17 97%  Intake/Output Summary (Last 24 hours) at 04/08/2018 0728 Last data filed at 04/07/2018 1658 Gross per 24 hour  Intake 600 ml  Output -  Net 600 ml    Palpable DP left LE Groin incision soft and dry, medial vein harvest site Dry dressing as needed to incision Heart RRR Lungs non labored  Assessment/Planning: POD # 3 Left femoral to above-knee popliteal bypass with 6 mm propaten,intraoperative arteriogram  Patent arterial flow with distal palpable DP pulse on the left LE and improved ABI's. Ambulating with rolling walker No HH follow up recommended Stable for discharge home today F/U in 2-3 weeks  Pamela Perez 04/08/2018 7:28 AM -- Agree with above.  Dc home today  Fabienne Bruns, MD [facility] [place] Office: [phone] Pager: [phone]  Laboratory Lab Results: Recent Labs    04/07/18 0258 04/08/18 0306  WBC 9.8 10.9*  HGB 9.7* 9.3*  HCT 29.1* 29.3*  PLT 181 204   BMET Recent Labs    04/07/18 0258 04/08/18 0306  NA 137 138  K 3.4* 3.4*  CL 108 105  CO2 21* 24  GLUCOSE 130* 128*  BUN 16 7*  CREATININE 0.90 0.72  CALCIUM 8.1* 8.6*    COAG Lab Results  Component Value Date   INR 0.99 03/29/2018   INR 1.13 07/16/2014   No results found for: PTT

## 2018-04-08 NOTE — Care Management Note (Signed)
Case Management Note Donn Pierini RN, BSN Transitions of Care Unit 4E- RN Case Manager [phone]  Patient Details  Name: Pamela Perez MRN: [id number] Date of Birth: 04-24-1950  Subjective/Objective:   Pt admitted s/p fempop bypass                  Action/Plan: PTA pt lived at home with spouse, plan to return home, d/c medications to be filled by [facility] pharmacy and delivered to bedside prior to discharge. No CM needs noted for transition home.   Expected Discharge Date:  04/08/18               Expected Discharge Plan:  Home/Self Care  In-House Referral:  NA  Discharge planning Services  CM Consult  Post Acute Care Choice:  NA Choice offered to:  NA  DME Arranged:    DME Agency:     HH Arranged:    HH Agency:     Status of Service:  Completed, signed off  If discussed at Long Length of Stay Meetings, dates discussed:    Discharge Disposition: home/self care   Additional Comments:  Darrold Span, RN 04/08/2018, 9:55 AM

## 2018-04-13 NOTE — Discharge Summary (Signed)
[facility] Discharge Summary   Patient ID:  Pamela Perez MRN: [id number] DOB/AGE: [birth year] 68 y.o.  Admit date: 04/05/2018 Discharge date:04/08/2018 Date of Surgery: 04/05/2018 Surgeon: Surgeon(s): Fields, Janetta Hora, MD  Admission Diagnosis: PERIPHERAL VASCULAR DISEASE WITH CLAUDICATION  Discharge Diagnoses:  PERIPHERAL VASCULAR DISEASE WITH CLAUDICATION  Secondary Diagnoses: Past Medical History:  Diagnosis Date  . Family history of adverse reaction to anesthesia    patient states brother had "problems with it" but she is unsure what it is  . Hypertension   . Peripheral vascular disease (HCC)     Procedure(s): LEFT FEMORAL TO ABOVE THE KNEE POPLITEAL ARTERY BYPASS GRAFT USING X 80CM GORE PROPATEN GRAFT  Discharged Condition: stable  HPI:  Pamela Perez a 68 y.o.female,with a known history of a left superficial femoral artery occlusion. She had a left SFA Viabahnstent placed by Dr. Claudie Fisherman in 2016. This was done after a ground-level fall where the patient experienced a hematoma in her left thigh. The stent was subsequently noted to be occluded December 2019. At that time she was experiencing claudication symptoms. She currently still experiences claudication symptoms in the left leg after walking about 1/4 mile. Her symptoms resolved when she with rest. Has no right leg symptoms. Other medical problems includeattention which is well controlled. She is a non-smoker. She did not have a history of elevated cholesterol. Sheis currently on Plavix.  Her stent was occluded on duplex exam.  She was scheduled for bypass surgery.    Hospital Course:  Pamela Perez is a 68 y.o. female is S/P Left Procedure(s): LEFT FEMORAL TO ABOVE THE KNEE POPLITEAL ARTERY BYPASS GRAFT USING X 80CM GORE PROPATEN GRAFT  Post op she had patent arterial flow in the left LE with brisk doppler PT/DP biphasic.   Prior to discharge she had a palpable DP  pulse and was ambulating independently with a rolling walker.    Significant Diagnostic Studies: CBC Lab Results  Component Value Date   WBC 10.9 (H) 04/08/2018   HGB 9.3 (L) 04/08/2018   HCT 29.3 (L) 04/08/2018   MCV 91.6 04/08/2018   PLT 204 04/08/2018    BMET    Component Value Date/Time   NA 138 04/08/2018 0306   K 3.4 (L) 04/08/2018 0306   CL 105 04/08/2018 0306   CO2 24 04/08/2018 0306   GLUCOSE 128 (H) 04/08/2018 0306   BUN 7 (L) 04/08/2018 0306   CREATININE 0.72 04/08/2018 0306   CALCIUM 8.6 (L) 04/08/2018 0306   GFRNONAA >60 04/08/2018 0306   GFRAA >60 04/08/2018 0306   COAG Lab Results  Component Value Date   INR 0.99 03/29/2018   INR 1.13 07/16/2014     Disposition:  Discharge to :Home Discharge Instructions    Call MD for:  redness, tenderness, or signs of infection (pain, swelling, bleeding, redness, odor or green/yellow discharge around incision site)   Complete by:  As directed    Call MD for:  severe or increased pain, loss or decreased feeling  in affected limb(s)   Complete by:  As directed    Call MD for:  temperature >100.5   Complete by:  As directed    Resume previous diet   Complete by:  As directed      Allergies as of 04/08/2018   No Known Allergies     Medication List    STOP taking these medications   clopidogrel 75 MG tablet Commonly known as:  PLAVIX  TAKE these medications   lisinopril 20 MG tablet Commonly known as:  PRINIVIL,ZESTRIL Take 20 mg by mouth at bedtime.   lisinopril-hydrochlorothiazide 20-25 MG tablet Commonly known as:  PRINZIDE,ZESTORETIC Take 1 tablet by mouth daily.   metoprolol succinate 100 MG 24 hr tablet Commonly known as:  TOPROL-XL Take 100 mg by mouth 2 (two) times daily.   metroNIDAZOLE 0.75 % cream Commonly known as:  METROCREAM Apply 1 application topically daily.   milk thistle 175 MG tablet Take 175 mg by mouth daily.   oxyCODONE-acetaminophen 5-325 MG tablet Commonly known as:   PERCOCET/ROXICET Take 1-2 tablets by mouth every 4 (four) hours as needed for moderate pain.      Verbal and written Discharge instructions given to the patient. Wound care per Discharge AVS Follow-up Information    Sherren Kerns, MD In 2 weeks.   Specialties:  Vascular Surgery, Cardiology Why:  Office will call you to arrange your appt (sent) Contact information: [address] [place] [address] [phone]           Signed: Mosetta Pigeon 04/13/2018, 9:33 AM - For VQI Registry use --- Instructions: Press F2 to tab through selections.  Delete question if not applicable.   Post-op:  Wound infection: No  Graft infection: No  Transfusion: No  If yes, 0 units given New Arrhythmia: No Ipsilateral amputation: [x ] no, [ ]  Minor, [ ]  BKA, [ ]  AKA Discharge patency: [x ] Primary, [ ]  Primary assisted, [ ]  Secondary, [ ]  Occluded Patency judged by: [ ]  Dopper only, [ ]  Palpable graft pulse, [ ]  Palpable distal pulse, [ ]  ABI inc. > 0.15, [ ]  Duplex Discharge ABI: R 1.18, L 1.15 Discharge TBI: R 0.77, L 0.69 D/C Ambulatory Status: Ambulatory with Assistance  Complications: MI: [x ] No, [ ]  Troponin only, [ ]  EKG or Clinical CHF: No Resp failure: [x ] none, [ ]  Pneumonia, [ ]  Ventilator Chg in renal function: x[ ]  none, [ ]  Inc. Cr > 0.5, [ ]  Temp. Dialysis, [ ]  Permanent dialysis Stroke: [x ] None, [ ]  Minor, [ ]  Major Return to OR: No  Reason for return to OR: [ ]  Bleeding, [ ]  Infection, [ ]  Thrombosis, [ ]  Revision  Discharge medications: Statin use:  No  for medical reason   ASA use:  No  for medical reason   Plavix use:  No  for medical reason   Beta blocker use: Yes Coumadin use: No  for medical reason

## 2018-04-23 ENCOUNTER — Telehealth: Payer: Self-pay | Admitting: *Deleted

## 2018-04-23 NOTE — Telephone Encounter (Signed)
Call from patients spouse. Small to moderate amount of mostly clear weeping from leg incision worst wshen up. DENIES any redness heat, fever. Claims swelling has decreased over time since surgery. Has appt next week at this office. Instructed to elevate leg higher than level of heart. Keep clean and covered when up. Report to ER for any signs/symptoms of infection or worsening condition.

## 2018-04-23 NOTE — Telephone Encounter (Signed)
Patient left message on TRIAGE line. Call back no answer/mailbox full.

## 2018-04-29 ENCOUNTER — Ambulatory Visit (INDEPENDENT_AMBULATORY_CARE_PROVIDER_SITE_OTHER): Payer: Medicare Other | Admitting: Vascular Surgery

## 2018-04-29 ENCOUNTER — Other Ambulatory Visit: Payer: Self-pay

## 2018-04-29 ENCOUNTER — Encounter: Payer: Self-pay | Admitting: Vascular Surgery

## 2018-04-29 VITALS — BP 145/68 | HR 53 | Temp 98.1°F | Resp 16 | Ht 59.0 in | Wt 145.2 lb

## 2018-04-29 DIAGNOSIS — I739 Peripheral vascular disease, unspecified: Secondary | ICD-10-CM

## 2018-04-29 MED ORDER — CARRASYN HYDROGEL WOUND DRESS EX GEL: CUTANEOUS | 10 refills | Status: AC | PRN

## 2018-04-29 NOTE — Progress Notes (Signed)
Patient is a 68 year old female who returns for postoperative follow-up today.  She underwent left femoral to above-knee popliteal bypass with prosthetic bruit 17th 2020.  She has had some separation of her above-knee popliteal incision.  She is placing dry dressings on this currently.  She reports her claudication symptoms have completely resolved.  Physical exam:  Vitals:   04/29/18 1315  BP: (!) 145/68  Pulse: (!) 53  Resp: 16  Temp: 98.1 F (36.7 C)  TempSrc: Oral  SpO2: 97%  Weight: 145 lb 3.2 oz (65.9 kg)  Height: 4\' 11"  (1.499 m)    Left lower extremity: 2+ left dorsalis pedis pulse healing incisions except for the above-knee popliteal incision.  This is granulating well.  But it is still open 5 cm in length 1 cm with 1 cm depth  Assessment: Doing well status post left femoral to above-knee popliteal bypass with resolution of claudication symptoms after previously occluded left superficial femoral artery stenting.  Plan: The patient will follow-up in 1 month's time and have ABIs and a duplex at that point.  She will do hydrogel dressings on the left above-knee popliteal incision once daily.  Fabienne Bruns, MD [facility] [place] Office: [phone] Pager: [phone]

## 2018-05-24 ENCOUNTER — Other Ambulatory Visit: Payer: Self-pay

## 2018-05-24 DIAGNOSIS — I739 Peripheral vascular disease, unspecified: Secondary | ICD-10-CM

## 2018-06-02 ENCOUNTER — Telehealth (HOSPITAL_COMMUNITY): Payer: Self-pay | Admitting: Rehabilitation

## 2018-06-02 NOTE — Telephone Encounter (Signed)
The above patient or their representative was contacted and gave the following answers to these questions:         Do you have any of the following symptoms? No  Fever                    Cough                   Shortness of breath  Do  you have any of the following other symptoms? No   muscle pain         vomiting,        diarrhea        rash         weakness        red eye        abdominal pain         bruising          bruising or bleeding              joint pain           severe headache    Have you been in contact with someone who was or has been sick in the past 2 weeks? No  Yes                 Unsure                         Unable to assess   Does the person that you were in contact with have any of the following symptoms? n/a  Cough         shortness of breath           muscle pain         vomiting,            diarrhea            rash            weakness           fever            red eye           abdominal pain           bruising  or  bleeding                joint pain                severe headache               Have you  or someone you have been in contact with traveled internationally in th last month? No         If yes, which countries? n/a   Have you  or someone you have been in contact with traveled outside [place] in th last month? No        If yes, which state and city? n/a   COMMENTS OR ACTION PLAN FOR THIS PATIENT:          

## 2018-06-03 ENCOUNTER — Encounter: Payer: Self-pay | Admitting: Vascular Surgery

## 2018-06-03 ENCOUNTER — Ambulatory Visit (HOSPITAL_COMMUNITY)
Admission: RE | Admit: 2018-06-03 | Discharge: 2018-06-03 | Disposition: A | Discharge status: Home / Self Care | Payer: Medicare Other | Source: Ambulatory Visit | Attending: Family | Admitting: Family

## 2018-06-03 ENCOUNTER — Ambulatory Visit (INDEPENDENT_AMBULATORY_CARE_PROVIDER_SITE_OTHER)
Admission: RE | Admit: 2018-06-03 | Discharge: 2018-06-03 | Disposition: A | Discharge status: Home / Self Care | Payer: Medicare Other | Source: Ambulatory Visit | Attending: Family | Admitting: Family

## 2018-06-03 ENCOUNTER — Other Ambulatory Visit: Payer: Self-pay

## 2018-06-03 ENCOUNTER — Ambulatory Visit (INDEPENDENT_AMBULATORY_CARE_PROVIDER_SITE_OTHER): Payer: Self-pay | Admitting: Vascular Surgery

## 2018-06-03 VITALS — BP 157/78 | HR 58 | Temp 97.6°F | Resp 18 | Ht 59.0 in | Wt 147.0 lb

## 2018-06-03 DIAGNOSIS — I739 Peripheral vascular disease, unspecified: Secondary | ICD-10-CM

## 2018-06-03 NOTE — Progress Notes (Signed)
Patient is a 68 year old female who returns for postoperative follow-up today.  She underwent a left femoral to above-knee popliteal bypass after a chronic stent occlusion in her SFA.  This was done on April 05, 2018.  She currently reports no claudication symptoms.  She did have some difficulty healing of her above-knee popliteal incision but this is essentially healed at this point.  Has some numbness and tingling sensation and dysesthesias around the left anterior leg and tips of her first and second toes.  Physical exam:  Vitals:   06/03/18 1048  BP: (!) 157/78  Pulse: (!) 58  Resp: 18  Temp: 97.6 F (36.4 C)  SpO2: 98%  Weight: 147 lb (66.7 kg)  Height: 4\' 11"  (1.499 m)    Well-healed left groin and above-knee popliteal incision.  Left foot pink warm  Data: Bilateral ABIs were performed today which I reviewed and interpreted.  These were greater than 1 normal and triphasic bilaterally.  Duplex ultrasound of her left lower extremity bypass graft showed wide patency with no narrowing.  Assessment: Doing well status post left femoral to above-knee popliteal bypass.  Plan: The patient will follow-up in 3 months time with a repeat graft duplex and ABIs.  She will see our nurse practitioner at that office visit.  Fabienne Bruns, MD [facility] [place] Office: [phone] Pager: [phone]

## 2018-07-29 ENCOUNTER — Encounter (HOSPITAL_COMMUNITY): Payer: Medicare Other

## 2018-07-29 ENCOUNTER — Ambulatory Visit: Payer: Medicare Other | Admitting: Family

## 2018-08-31 ENCOUNTER — Other Ambulatory Visit: Payer: Self-pay

## 2018-08-31 DIAGNOSIS — I739 Peripheral vascular disease, unspecified: Secondary | ICD-10-CM

## 2018-09-09 ENCOUNTER — Encounter: Payer: Self-pay | Admitting: Family

## 2018-09-09 ENCOUNTER — Ambulatory Visit (INDEPENDENT_AMBULATORY_CARE_PROVIDER_SITE_OTHER): Payer: Medicare Other | Admitting: Family

## 2018-09-09 ENCOUNTER — Ambulatory Visit (INDEPENDENT_AMBULATORY_CARE_PROVIDER_SITE_OTHER)
Admission: RE | Admit: 2018-09-09 | Discharge: 2018-09-09 | Disposition: A | Discharge status: Home / Self Care | Payer: Medicare Other | Source: Ambulatory Visit | Attending: Family | Admitting: Family

## 2018-09-09 ENCOUNTER — Other Ambulatory Visit: Payer: Self-pay

## 2018-09-09 ENCOUNTER — Ambulatory Visit (HOSPITAL_COMMUNITY)
Admission: RE | Admit: 2018-09-09 | Discharge: 2018-09-09 | Disposition: A | Discharge status: Home / Self Care | Payer: Medicare Other | Source: Ambulatory Visit | Attending: Family | Admitting: Family

## 2018-09-09 VITALS — BP 142/74 | HR 52 | Temp 98.2°F | Resp 18 | Ht 59.0 in | Wt 154.9 lb

## 2018-09-09 DIAGNOSIS — Z95820 Peripheral vascular angioplasty status with implants and grafts: Secondary | ICD-10-CM

## 2018-09-09 DIAGNOSIS — I739 Peripheral vascular disease, unspecified: Secondary | ICD-10-CM

## 2018-09-09 DIAGNOSIS — S75002D Unspecified injury of femoral artery, left leg, subsequent encounter: Secondary | ICD-10-CM

## 2018-09-09 NOTE — Progress Notes (Signed)
[facility] [place]   CC: Follow up peripheral artery occlusive disease  History of Present Illness DORISMAR CHAY is a 68 y.o. female who is s/p left femoral to above-knee popliteal bypass after a chronic stent occlusion in her SFA on April 05, 2018 by Dr. Oneida Alar.    She states that the mild swelling in both of her legs mostly resolves by morning with overnight elevation of her legs.  She reports numbness at the anterior aspect of her left lower leg that has improved somewhat, started after the February 2020 left leg arterial intervention.   She does not seem to have claudication symptoms.   Dr. Oneida Alar last evaluated pt on 06-03-18. At that time pt had well healed left groin and above-knee popliteal incisions.  Left foot was pink and warm Bilateral ABIs were normal and triphasic bilaterally.  Duplex ultrasound of her left lower extremity bypass graft showed wide patency with no narrowing. She was doing well status post left femoral to above-knee popliteal bypass. She was to follow-up in 3 months with a repeat graft duplex and ABIs.    She initially presented >6 hours after falling out a golf cart which resulted initially in a left prepatellar laceration which was repaired in the ED. She then progressed to develop a large hematoma in the left distal thigh. A CTA was completed which demonstrated possible superficial femoral artery branches feeding the hematoma. The skin overlying the hematoma was tense and ischemic in appearance, so Dr. Bridgett Larsson felt immediate intervention was indicated. Dr. Bridgett Larsson stented the Left superficial femoral artery with a covered stent to cover the feeding branches to the hematoma on 07/16/14 and then evacuated a large hematoma on 07/20/14.  Prior procedures include: 1. PTA L SFA for in-stent restenosis, Jetstream atherectomy L SFA stent (11/14/15) by Dr. Donzetta Matters 2. Washout of L thigh wound, VAC placement (07/20/14) 3. L SFA PTA+S for massive L  thigh hematoma related to traumatic fall (07/16/14) by Dr. Bridgett Larsson  She denies tingling or numbness in either hand.  She denies chest pain or dyspnea, denies fever or chills.   Diabetic: No Tobacco use: non-smoker  Pt meds include: Statin :No, has never been on a statin Betablocker: Yes ASA: 81 mg daily Other anticoagulants/antiplatelets: no  Past Medical History:  Diagnosis Date  . Family history of adverse reaction to anesthesia    patient states brother had "problems with it" but she is unsure what it is  . Hypertension   . Peripheral vascular disease [facility])     Social History Social History   Tobacco Use  . Smoking status: Never Smoker  . Smokeless tobacco: Never Used  Substance Use Topics  . Alcohol use: No    Alcohol/week: 0.0 standard drinks  . Drug use: No    Family History Family History  Problem Relation Age of Onset  . Arthritis Mother        Gout  . Kidney failure Mother   . Heart disease Father        Before age 42  . Hyperlipidemia Father   . Heart attack Father     Past Surgical History:  Procedure Laterality Date  . ABDOMINAL AORTOGRAM W/LOWER EXTREMITY Bilateral 03/12/2018   Procedure: ABDOMINAL AORTOGRAM W/LOWER EXTREMITY;  Surgeon: Elam Dutch, MD;  Location: [facility];  Service: Cardiovascular;  Laterality: Bilateral;  . APPLICATION OF WOUND VAC Left 07/20/2014   Procedure: APPLICATION OF WOUND VAC;  Surgeon: Conrad [place], MD;  Location:  [facility] OR;  Service: Vascular;  Laterality: Left;  . CHOLECYSTECTOMY    . FEMORAL-POPLITEAL BYPASS GRAFT Left 04/05/2018   Procedure: LEFT FEMORAL TO ABOVE THE KNEE POPLITEAL ARTERY BYPASS GRAFT USING 6MM X 80CM GORE PROPATEN GRAFT;  Surgeon: Sherren KernsFields, Charles E, MD;  [facility];  Service: Vascular;  Laterality: Left;  . I&D EXTREMITY Left 07/20/2014   Procedure: IRRIGATION AND DEBRIDEMENT EXTREMITY;  Surgeon: Fransisco HertzBrian L Chen, MD;  [facility];  Service: Vascular;  Laterality: Left;  . INSERTION OF  ILIAC STENT Left 07/16/2014   Procedure: Left Leg Angiogram, Stenting of Left Superficial Femoral Artery, Repair of Pre-Patella Laceration, Evacuation of Left Thigh Hematoma;  Surgeon: Fransisco HertzBrian L Chen, MD;  [facility];  Service: Vascular;  Laterality: Left;  . PERIPHERAL VASCULAR CATHETERIZATION Left 11/14/2015   Procedure: Lower Extremity Angiography;  Surgeon: Maeola HarmanBrandon Christopher Cain, MD;  [facility];  Service: Cardiovascular;  Laterality: Left;  . PERIPHERAL VASCULAR CATHETERIZATION Left 11/14/2015   Procedure: Peripheral Vascular Atherectomy;  Surgeon: Maeola HarmanBrandon Christopher Cain, MD;  [facility];  Service: Cardiovascular;  Laterality: Left;    No Known Allergies  Current Outpatient Medications  Medication Sig Dispense Refill  . aspirin 325 MG EC tablet Take 325 mg by mouth daily.    Marland Kitchen. lisinopril-hydrochlorothiazide (PRINZIDE,ZESTORETIC) 20-25 MG tablet Take 1 tablet by mouth daily.     . metoprolol succinate (TOPROL-XL) 100 MG 24 hr tablet Take 100 mg by mouth 2 (two) times daily.     . metroNIDAZOLE (METROCREAM) 0.75 % cream Apply 1 application topically daily.    . milk thistle 175 MG tablet Take 175 mg by mouth daily.    . Carbomer Gel Base (HYDROGEL) GEL APPLY THIN LAYER TO LEG WOUND ONCE DAILY    . Wound Dressings (ALLANTOIN) gel Apply topically as needed for wound care. Apply thin layer to leg wound once daily cover with dry gauze (Patient not taking: Reported on 06/03/2018) 15 g 10   No current facility-administered medications for this visit.     ROS: See HPI for pertinent positives and negatives.   Physical Examination  Vitals:   09/09/18 1026  BP: (!) 142/74  Pulse: (!) 52  Resp: 18  Temp: 98.2 F (36.8 C)  SpO2: 96%  Weight: 154 lb 14.4 oz (70.3 kg)  Height: 4\' 11"  (1.499 m)   Body mass index is 31.29 kg/m.  General: A&O x 3, WDWN, obese short stature female. Gait: normal HEENT: No gross abnormalities.  Pulmonary:  Respirations are non labored, CTAB, good air movement in all fields Cardiac: regular rhythm, no detected murmur.         Carotid Bruits Right Left   Negative Negative   Radial pulses are 2+ right, faintly palpable left  Adominal aortic pulse is not palpable                         VASCULAR EXAM: Extremities without ischemic changes, without Gangrene; without open wounds. 1+ bilateral pretibial edema: pitting in right, pitting and non pitting in left  LE Pulses Right Left       FEMORAL  1+ palpable  1+ palpable        POPLITEAL  not palpable   not palpable       POSTERIOR TIBIAL  1+ palpable   1+ palpable        DORSALIS PEDIS      ANTERIOR TIBIAL not palpable  not palpable    Abdomen: soft, NT, no palpable masses. Skin: no rashes, no cellulitis, no ulcers noted. Musculoskeletal: no muscle wasting or atrophy.  Neurologic: A&O X 3; appropriate affect, Sensation is normal except is diminished at left anterior lower leg; MOTOR FUNCTION:  moving all extremities equally, motor strength 5/5 throughout. Speech is fluent/normal. CN 2-12 intact. Psychiatric: Thought content is normal, mood appropriate for clinical situation.    DATA  Left LE Arterial Duplex (09-09-18): Left Graft #1: femoral to above-knee popliteal +--------------------+--------+--------+----------+--------+                     PSV cm/sStenosisWaveform  Comments +--------------------+--------+--------+----------+--------+ Inflow              156             biphasic           +--------------------+--------+--------+----------+--------+ Proximal Anastomosis163             biphasic           +--------------------+--------+--------+----------+--------+ Proximal Graft      135             biphasic           +--------------------+--------+--------+----------+--------+ Mid Graft           52               monophasic         +--------------------+--------+--------+----------+--------+ Distal Graft        47              biphasic           +--------------------+--------+--------+----------+--------+ Distal Anastamosis  61              biphasic           +--------------------+--------+--------+----------+--------+ Outflow             41              monophasic         +--------------------+--------+--------+----------+--------+ Summary: Left: Normal examination. No evidence of arterial occlusive disease involving the femoropopliteal bypass graft.    ABI (Date: 09/09/2018): ABI Findings: +---------+------------------+-----+---------+--------+ Right    Rt Pressure (mmHg)IndexWaveform Comment  +---------+------------------+-----+---------+--------+ Brachial 155                                      +---------+------------------+-----+---------+--------+ PTA      157               1.01 triphasic         +---------+------------------+-----+---------+--------+ DP       151               0.97 biphasic          +---------+------------------+-----+---------+--------+ Great Toe137               0.88                   +---------+------------------+-----+---------+--------+  +---------+------------------+-----+--------+-------+ Left     Lt Pressure (mmHg)IndexWaveformComment +---------+------------------+-----+--------+-------+  Brachial 155                                    +---------+------------------+-----+--------+-------+ PTA      144               0.93 biphasic        +---------+------------------+-----+--------+-------+ DP       143               0.92 biphasic        +---------+------------------+-----+--------+-------+ Great Toe122               0.79                  +---------+------------------+-----+--------+-------+  +-------+-----------+-----------+------------+------------+ ABI/TBIToday's ABIToday's TBIPrevious ABIPrevious TBI +-------+-----------+-----------+------------+------------+ Right  1.01       0.88       1.02        0.95         +-------+-----------+-----------+------------+------------+ Left   0.93       0.79       1.03        0.91         +-------+-----------+-----------+------------+------------+ Summary: Right: Resting right ankle-brachial index is within normal range. No evidence of significant right lower extremity arterial disease. The right toe-brachial index is normal. Left: Resting left ankle-brachial index indicates mild left lower extremity arterial disease. The left toe-brachial index is normal.    ASSESSMENT: Melvenia BeamRebecca A Berninger is a 68 y.o. female who presented after a fall leading to massive L thigh hematoma requiring placement of L SFA covered stent in 2016 by Dr. Imogene Burnhen to manage muscular bleeding. She then required an atherectomy of L SFA stent for in-stent restenosis in 2017 by Dr. Randie Heinzain Most recently she required a left femoral to above-knee popliteal bypass after a chronic stent occlusion in her SFA on April 05, 2018 by Dr. Darrick PennaFields.   She does not seem to have claudication symptoms with walking. There are no signs of ischemia in her feet or legs.  Left leg arterial duplex today shows no stenosis in the femoral to above-knee popliteal bypass graft, biphasic and monophasic waveforms.  ABI's today: right remains normal with tri and biphasic waveforms, left has declined slightly from normal to mild disease with biphasic waveforms. Bilateral TBI have declined but remain normal.    PLAN:  Based on the patient's vascular studies and examination, pt will return to clinic in 3 months with ABI's and left LE arterial duplex. I advised her to notify us if she develops concerns re the circulation in her  feet or legs.  Walk at least 30 minutes total daily in a safe environment.   I discussed in depth with the patient the nature of atherosclerosis, and emphasized the importance of maximal medical management including strict control of blood pressure, blood glucose, and lipid levels, obtaining regular exercise, and continued cessation of smoking.  The patient is aware that without maximal medical management the underlying atherosclerotic disease process will progress, limiting the benefit of any interventions.  The patient was given information about PAD including signs, symptoms, treatment, what symptoms should prompt the patient to seek immediate medical care, and risk reduction measures to take.  Charisse MarchSuzanne Elexia Friedt, RN, MSN, FNP-C [facility] [place] Office Phone: [phone]  Clinic MD: Edilia BoDickson  09/09/18 11:04 AM

## 2018-12-01 ENCOUNTER — Other Ambulatory Visit: Payer: Self-pay

## 2018-12-01 DIAGNOSIS — I739 Peripheral vascular disease, unspecified: Secondary | ICD-10-CM

## 2018-12-03 ENCOUNTER — Telehealth (HOSPITAL_COMMUNITY): Payer: Self-pay

## 2018-12-03 NOTE — Telephone Encounter (Signed)

## 2018-12-06 ENCOUNTER — Ambulatory Visit (INDEPENDENT_AMBULATORY_CARE_PROVIDER_SITE_OTHER)
Admission: RE | Admit: 2018-12-06 | Discharge: 2018-12-06 | Disposition: A | Discharge status: Home / Self Care | Payer: Medicare Other | Source: Ambulatory Visit | Attending: Family | Admitting: Family

## 2018-12-06 ENCOUNTER — Encounter: Payer: Self-pay | Admitting: Family

## 2018-12-06 ENCOUNTER — Ambulatory Visit (INDEPENDENT_AMBULATORY_CARE_PROVIDER_SITE_OTHER): Payer: Medicare Other | Admitting: Family

## 2018-12-06 ENCOUNTER — Ambulatory Visit (HOSPITAL_COMMUNITY)
Admission: RE | Admit: 2018-12-06 | Discharge: 2018-12-06 | Disposition: A | Discharge status: Home / Self Care | Payer: Medicare Other | Source: Ambulatory Visit | Attending: Family | Admitting: Family

## 2018-12-06 ENCOUNTER — Other Ambulatory Visit: Payer: Self-pay

## 2018-12-06 VITALS — BP 138/63 | HR 51 | Temp 96.9°F | Resp 14 | Ht 59.0 in | Wt 148.0 lb

## 2018-12-06 DIAGNOSIS — I739 Peripheral vascular disease, unspecified: Secondary | ICD-10-CM | POA: Diagnosis present

## 2018-12-06 DIAGNOSIS — R0989 Other specified symptoms and signs involving the circulatory and respiratory systems: Secondary | ICD-10-CM

## 2018-12-06 DIAGNOSIS — S75002D Unspecified injury of femoral artery, left leg, subsequent encounter: Secondary | ICD-10-CM

## 2018-12-06 DIAGNOSIS — Z95820 Peripheral vascular angioplasty status with implants and grafts: Secondary | ICD-10-CM | POA: Diagnosis not present

## 2018-12-06 NOTE — Patient Instructions (Signed)

## 2018-12-06 NOTE — Progress Notes (Signed)
[facility] [place]   CC: Follow up peripheral artery occlusive disease  History of Present Illness Pamela Perez is a 68 y.o. female who is s/p left femoral to above-knee popliteal bypass after a chronic stent occlusion in her SFA on April 05, 2018 by Dr. Darrick Penna.   She states that the mild swelling in both of her legs mostly resolves by morning with overnight elevation of her legs.   She does not seem to have claudication symptoms.   Dr. Darrick Penna last evaluated pt on 06-03-18. At that time pt had well healed left groin and above-knee popliteal incisions. Left foot was pink and warm Bilateral ABIs were normal and triphasic bilaterally. Duplex ultrasound of her left lower extremity bypass graft showed wide patency with no narrowing. She was doing well status post left femoral to above-knee popliteal bypass. She was to follow-up in 3 months with a repeat graft duplex and ABIs.   She initially presented>6 hours after falling out a golf cart which resulted initially in a left prepatellar laceration which was repaired in the ED. She then progressed to develop a large hematoma in the left distal thigh. A CTA was completed which demonstrated possible superficial femoral artery branches feeding the hematoma. The skin overlying the hematoma was tense and ischemic in appearance, so Dr. Imogene Burn felt immediate intervention was indicated. Dr. Imogene Burn stented the Left superficial femoral artery with a covered stent to cover the feeding branches to the hematoma on 07/16/14 and then evacuated a large hematoma on 07/20/14.  Prior procedures include: 1. PTA L SFA for in-stent restenosis, Jetstream atherectomy L SFA stent (11/14/15)by Dr. Randie Heinz 2. Washout of L thigh wound, VAC placement (07/20/14) 3. L SFA PTA+S for massive L thigh hematoma related to traumatic fall (07/16/14)by Dr. Imogene Burn  She denies tingling or numbness in either hand.  She denies chest pain or dyspnea,  denies fever or chills.   She denies any history of stroke or TIA.   Diabetic:No Tobacco use:non-smoker  Pt meds include: Statin :yes, she takes rosuvastatin 20 mg daily, not atorvastatin  Betablocker:Yes ASA:325 mg daily Other anticoagulants/antiplatelets:no    Past Medical History:  Diagnosis Date  . Family history of adverse reaction to anesthesia    patient states brother had "problems with it" but she is unsure what it is  . Hypertension   . Peripheral vascular disease [facility])     Social History Social History   Tobacco Use  . Smoking status: Never Smoker  . Smokeless tobacco: Never Used  Substance Use Topics  . Alcohol use: No    Alcohol/week: 0.0 standard drinks  . Drug use: No    Family History Family History  Problem Relation Age of Onset  . Arthritis Mother        Gout  . Kidney failure Mother   . Heart disease Father        Before age 40  . Hyperlipidemia Father   . Heart attack Father     Past Surgical History:  Procedure Laterality Date  . ABDOMINAL AORTOGRAM W/LOWER EXTREMITY Bilateral 03/12/2018   Procedure: ABDOMINAL AORTOGRAM W/LOWER EXTREMITY;  Surgeon: Sherren Kerns, MD;  Location: [facility] INVASIVE CV LAB;  Service: Cardiovascular;  Laterality: Bilateral;  . APPLICATION OF WOUND VAC Left 07/20/2014   Procedure: APPLICATION OF WOUND VAC;  Surgeon: Fransisco Hertz, MD;  Location: [facility] OR;  Service: Vascular;  Laterality: Left;  . CHOLECYSTECTOMY    . FEMORAL-POPLITEAL BYPASS GRAFT Left 04/05/2018   Procedure:  LEFT FEMORAL TO ABOVE THE KNEE POPLITEAL ARTERY BYPASS GRAFT USING X 80CM GORE PROPATEN GRAFT;  Surgeon: Sherren Kerns, MD;  Location: [facility] OR;  Service: Vascular;  Laterality: Left;  . I&D EXTREMITY Left 07/20/2014   Procedure: IRRIGATION AND DEBRIDEMENT EXTREMITY;  Surgeon: Fransisco Hertz, MD;  Location: [facility] OR;  Service: Vascular;  Laterality: Left;  . INSERTION OF ILIAC STENT Left 07/16/2014   Procedure: Left Leg Angiogram, Stenting of  Left Superficial Femoral Artery, Repair of Pre-Patella Laceration, Evacuation of Left Thigh Hematoma;  Surgeon: Fransisco Hertz, MD;  Location: [facility] OR;  Service: Vascular;  Laterality: Left;  . PERIPHERAL VASCULAR CATHETERIZATION Left 11/14/2015   Procedure: Lower Extremity Angiography;  Surgeon: Maeola Harman, MD;  Location: [facility] INVASIVE CV LAB;  Service: Cardiovascular;  Laterality: Left;  . PERIPHERAL VASCULAR CATHETERIZATION Left 11/14/2015   Procedure: Peripheral Vascular Atherectomy;  Surgeon: Maeola Harman, MD;  Location: [facility] INVASIVE CV LAB;  Service: Cardiovascular;  Laterality: Left;    No Known Allergies  Current Outpatient Medications  Medication Sig Dispense Refill  . aspirin 325 MG EC tablet Take 325 mg by mouth daily.    Marland Kitchen atorvastatin (LIPITOR) 20 MG tablet Take 20 mg by mouth daily.    Marland Kitchen lisinopril-hydrochlorothiazide (PRINZIDE,ZESTORETIC) 20-25 MG tablet Take 1 tablet by mouth daily.     . metoprolol succinate (TOPROL-XL) 100 MG 24 hr tablet Take 100 mg by mouth 2 (two) times daily.     . metroNIDAZOLE (METROCREAM) 0.75 % cream Apply 1 application topically daily.    . milk thistle 175 MG tablet Take 175 mg by mouth daily.    . Carbomer Gel Base (HYDROGEL) GEL APPLY THIN LAYER TO LEG WOUND ONCE DAILY    . Wound Dressings (ALLANTOIN) gel Apply topically as needed for wound care. Apply thin layer to leg wound once daily cover with dry gauze (Patient not taking: Reported on 06/03/2018) 15 g 10   No current facility-administered medications for this visit.     ROS: See HPI for pertinent positives and negatives.   Physical Examination  Vitals:   12/06/18 1044  BP: 138/63  Pulse: (!) 51  Resp: 14  Temp: (!) 96.9 F (36.1 C)  TempSrc: Temporal  SpO2: 100%  Weight: 148 lb (67.1 kg)  Height:  (1.499 m)   Body mass index is 29.89 kg/m.  General: A&O x 3, WDWN, short in stature female in NAD. Gait: normal HENT: No gross abnormalities.  Eyes:  Pupils are equal Pulmonary: Respirations are non labored, CTAB, good air movement in all fields Cardiac: regular rhythm, no detected murmur.         Carotid Bruits Right Left   Negative Positive   Radial pulses: right is faintly palpable, left is not palpable. Bilateral brachial pulses are 1+ palpable.  Adominal aortic pulse is not palpable                         VASCULAR EXAM: Extremities without ischemic changes, without Gangrene; without open wounds.  LE Pulses Right Left       FEMORAL  2+ palpable  2+ palpable        POPLITEAL  not palpable   not palpable       POSTERIOR TIBIAL  1+ palpable   not palpable        DORSALIS PEDIS      ANTERIOR TIBIAL not palpable  not palpable    Abdomen: soft, NT, no palpable masses. Skin: no rashes, no cellulitis, no ulcers noted. Musculoskeletal: no muscle wasting or atrophy.  Neurologic: A&O X 3; appropriate affect, Sensation is normal; MOTOR FUNCTION:  moving all extremities equally, motor strength 5/5 throughout. Speech is fluent/normal. CN 2-12 intact. Psychiatric: Thought content is normal, mood appropriate for clinical situation.    DATA  Left LE Arterial Duplex (12-06-18): LEFT      PSV cm/sRatioStenosisWaveformComments +----------+--------+-----+--------+--------+--------+ CFA Distal127                  biphasic         +----------+--------+-----+--------+--------+--------+ DFA       129                  biphasic         +----------+--------+-----+--------+--------+--------+    Left Graft #1: Fem-pop +--------------------+--------+--------+--------+--------+                     PSV cm/sStenosisWaveformComments +--------------------+--------+--------+--------+--------+ Inflow              144             biphasic          +--------------------+--------+--------+--------+--------+ Proximal Anastomosis156             biphasic         +--------------------+--------+--------+--------+--------+ Proximal Graft      48              biphasic         +--------------------+--------+--------+--------+--------+ Mid Graft           41              biphasic         +--------------------+--------+--------+--------+--------+ Distal Graft        52              biphasic         +--------------------+--------+--------+--------+--------+ Distal Anastomosis  106             biphasic         +--------------------+--------+--------+--------+--------+ Outflow             50              biphasic         +--------------------+--------+--------+--------+--------+ Summary: Left: Patent left fem-pop bypass graft with no evidence of stenosis.   Left LE Arterial Duplex (09-09-18): Left Graft #1: femoral to above-knee popliteal +--------------------+--------+--------+----------+--------+  PSV cm/sStenosisWaveform Comments +--------------------+--------+--------+----------+--------+ Inflow 156  biphasic   +--------------------+--------+--------+----------+--------+ Proximal Anastomosis163  biphasic   +--------------------+--------+--------+----------+--------+ Proximal Graft 135  biphasic   +--------------------+--------+--------+----------+--------+ Mid Graft 52  monophasic  +--------------------+--------+--------+----------+--------+ Distal Graft 47  biphasic   +--------------------+--------+--------+----------+--------+ Distal Anastamosis 61  biphasic   +--------------------+--------+--------+----------+--------+ Outflow 41  monophasic   +--------------------+--------+--------+----------+--------+ Summary: Left: Normal examination. No evidence of arterial occlusive disease involving the femoropopliteal bypass graft.   ABI Findings (12-06-18): +---------+------------------+-----+---------+--------+ Right    Rt Pressure (mmHg)IndexWaveform Comment  +---------+------------------+-----+---------+--------+ Brachial 139                                      +---------+------------------+-----+---------+--------+  ATA      151               1.03                   +---------+------------------+-----+---------+--------+ PTA      149               1.02 triphasic         +---------+------------------+-----+---------+--------+ DP                              triphasic         +---------+------------------+-----+---------+--------+ Great Toe122               0.84                   +---------+------------------+-----+---------+--------+  +---------+------------------+-----+---------+-------+ Left     Lt Pressure (mmHg)IndexWaveform Comment +---------+------------------+-----+---------+-------+ Brachial 146                                     +---------+------------------+-----+---------+-------+ ATA      111               0.76                  +---------+------------------+-----+---------+-------+ PTA      155               1.06 triphasic        +---------+------------------+-----+---------+-------+ DP                              biphasic         +---------+------------------+-----+---------+-------+ Great Toe128               0.88                  +---------+------------------+-----+---------+-------+  +-------+-----------+-----------+------------+------------+ ABI/TBIToday's ABIToday's TBIPrevious ABIPrevious TBI +-------+-----------+-----------+------------+------------+ Right  1.03       0.84       1.01        0.88          +-------+-----------+-----------+------------+------------+ Left   1.06       0.88       0.93        0.79         +-------+-----------+-----------+------------+------------+  Right ABIs appear essentially unchanged compared to prior study on 09/09/2018. Left ABIs appear increased compared to prior study on 09/09/2018.   Summary: Right: Resting right ankle-brachial index is within normal range. No evidence of significant right lower extremity arterial disease. The right toe-brachial index is normal.  Left: Resting left ankle-brachial index is within normal range. No evidence of significant left lower extremity arterial disease. The left toe-brachial index is normal.   ABI (Date: 09/09/2018): ABI Findings: +---------+------------------+-----+---------+--------+ Right Rt Pressure (mmHg)IndexWaveform Comment  +---------+------------------+-----+---------+--------+ Brachial 155     +---------+------------------+-----+---------+--------+ PTA 157 1.01 triphasic  +---------+------------------+-----+---------+--------+ DP 151 0.97 biphasic   +---------+------------------+-----+---------+--------+ Great Toe137 0.88    +---------+------------------+-----+---------+--------+  +---------+------------------+-----+--------+-------+ Left Lt Pressure (mmHg)IndexWaveformComment +---------+------------------+-----+--------+-------+ Brachial 155     +---------+------------------+-----+--------+-------+ PTA 144 0.93 biphasic  +---------+------------------+-----+--------+-------+ DP 143 0.92 biphasic  +---------+------------------+-----+--------+-------+ Great Toe122 0.79     +---------+------------------+-----+--------+-------+  +-------+-----------+-----------+------------+------------+ ABI/TBIToday's ABIToday's TBIPrevious ABIPrevious TBI +-------+-----------+-----------+------------+------------+ Right 1.01 0.88 1.02 0.95  +-------+-----------+-----------+------------+------------+ Left 0.93 0.79 1.03 0.91  +-------+-----------+-----------+------------+------------+ Summary: Right: Resting right  ankle-brachial index is within normal range. No evidence of significant right lower extremity arterial disease. The right toe-brachial index is normal. Left: Resting left ankle-brachial index indicates mild left lower extremity arterial disease. The left toe-brachial index is normal.     ASSESSMENT: Melvenia BeamRebecca A Perez is a 68 y.o. female who presented after a fall leading to massive L thigh hematoma requiring placement of L SFA covered stentin 2016 by Dr. Zoila Shutterhento manage muscular bleeding. She then required an atherectomy of L SFA stent for in-stent restenosisin 2017 by Dr. Randie Heinzain Most recently she required a left femoral to above-knee popliteal bypass after a chronic stent occlusion in her SFA on April 05, 2018 by Dr. Darrick PennaFields.   She does not seem to have claudication symptoms with walking. There are no signs of ischemia in her feet or legs.  Left leg arterial duplex today shows no stenosis in the femoral to above-knee popliteal bypass graft, biphasic waveforms.  ABI's today: right remains normal with triphasic waveforms, left has improved from mild disease to normal with tri and biphasic waveforms. Bilateral TBI remain normal.   She has a left carotid bruit with no history of stroke or TIA; she prefers to return next month with a carotid duplex instead of in 3 months.      PLAN:  She has a left carotid bruit with no history of stroke or TIA; she prefers to return next month  with a carotid duplex instead of in 3 months.    Based on the patient's vascular studies and examination, pt will return to clinic in  3 months with ABI's, and left LE arterial duplex.  I advised her to notify us if she develops concerns re the circulation in her feet or legs.  Walk at least 30 minutes total daily in a safe environment    Charisse MarchSuzanne Blas Riches, RN, MSN, FNP-C [facility] [place] Office Phone: [phone]  Clinic MD: Myra GianottiBrabham on call  12/06/18 11:10 AM

## 2018-12-09 ENCOUNTER — Other Ambulatory Visit: Payer: Self-pay

## 2018-12-09 DIAGNOSIS — R0989 Other specified symptoms and signs involving the circulatory and respiratory systems: Secondary | ICD-10-CM

## 2018-12-28 ENCOUNTER — Telehealth (HOSPITAL_COMMUNITY): Payer: Self-pay

## 2018-12-28 NOTE — Telephone Encounter (Signed)

## 2018-12-29 ENCOUNTER — Other Ambulatory Visit: Payer: Self-pay

## 2018-12-29 ENCOUNTER — Ambulatory Visit (HOSPITAL_COMMUNITY)
Admission: RE | Admit: 2018-12-29 | Discharge: 2018-12-29 | Disposition: A | Discharge status: Home / Self Care | Payer: Medicare Other | Source: Ambulatory Visit | Attending: Family | Admitting: Family

## 2018-12-29 ENCOUNTER — Telehealth: Payer: Self-pay | Admitting: *Deleted

## 2018-12-29 DIAGNOSIS — R0989 Other specified symptoms and signs involving the circulatory and respiratory systems: Secondary | ICD-10-CM | POA: Insufficient documentation

## 2018-12-29 NOTE — Telephone Encounter (Incomplete)
Virtual Visit Pre-Appointment Phone Call  Today, I spoke with Pamela Perez and performed the following actions:  1. I explained that we are currently trying to limit exposure to the COVID-19 virus by seeing patients at home rather than in the office.  I explained that the visits are best done by video, but can be done by telephone.  I asked the patient if a virtual visit that the patient would like to try instead of coming into the office. Pamela Perez agreed to proceed with the virtual visit scheduled with Suzannze Nickel PA on 12/30/18.    2. I confirmed the BEST phone number to call the day of the visit and- I included this in appointment notes.  3. I asked if the patient had access to (through a family member/friend) a smartphone with video capability to be used for her visit?"  The patient said yes -         If the patient said yes, I documented "VIDEO" in the appointment notes.  If the patient said no, I documented "PHONE" in the appointment notes.  4. I confirmed consent by  a. sending through [place] or by email the [place] as written at the end of this message or  b. verbally as listed below. i. This visit is being performed in the setting of COVID-19. ii. All virtual visits are billed to your insurance company just like a normal visit would be.   iii. We'd like you to understand that the technology does not allow for your provider to perform an examination, and thus may limit your provider's ability to fully assess your condition.  iv. If your provider identifies any concerns that need to be evaluated in person, we will make arrangements to do so.   v. Finally, though the technology is pretty good, we cannot assure that it will always work on either your or our end, and in the setting of a video visit, we may have to convert it to a phone-only visit.  In either situation, we cannot ensure that we have a secure connection.   vi. Are  you willing to proceed?"  STAFF: Did the patient verbally acknowledge consent to telehealth visit? Document YES/NO here: ***  2. I advised the patient to be prepared - I asked that the patient, on the day of her visit, record any information possible with the equipment at her home, such as blood pressure, pulse, oxygen saturation, and your weight and write them all down. I asked the patient to have a pen and paper handy nearby the day of the visit as well.  3. If the patient was scheduled for a video visit, I informed the patient that the visit with the doctor would start with a text to the smartphone # given to [name] by the patient.         If the patient was scheduled for a telephone call, I informed the patient that the visit with the doctor would start with a call to the telephone # given to [name] by the patient.  4. I Informed patient they will receive a phone call 15 minutes prior to their appointment time from a [place] or nurse to review medications, allergies, etc. to prepare for the visit.    TELEPHONE CALL NOTE  LORRAINA Perez has been deemed a candidate for a follow-up tele-health visit to limit community exposure during the Covid-19 pandemic. I spoke with the patient via phone  to ensure availability of phone/video source, confirm preferred email & phone number, and discuss instructions and expectations.  I reminded Pamela DONALSON to be prepared with any vital sign and/or heart rhythm information that could potentially be obtained via home monitoring, at the time of her visit. I reminded Pamela Perez to expect a phone call prior to her visit.  Pamela Perez, NT 12/29/2018 11:09 AM     FULL LENGTH CONSENT FOR TELE-HEALTH VISIT   I hereby voluntarily request, consent and authorize [facility] and its employed or contracted physicians, physician assistants, nurse practitioners or other licensed health care professionals (the Practitioner), to provide me with telemedicine  health care services (the "Services") as deemed necessary by the treating Practitioner. I acknowledge and consent to receive the Services by the Practitioner via telemedicine. I understand that the telemedicine visit will involve communicating with the Practitioner through live audiovisual communication technology and the disclosure of certain medical information by electronic transmission. I acknowledge that I have been given the opportunity to request an in-person assessment or other available alternative prior to the telemedicine visit and am voluntarily participating in the telemedicine visit.  I understand that I have the right to withhold or withdraw my consent to the use of telemedicine in the course of my care at any time, without affecting my right to future care or treatment, and that the Practitioner or I may terminate the telemedicine visit at any time. I understand that I have the right to inspect all information obtained and/or recorded in the course of the telemedicine visit and may receive copies of available information for a reasonable fee.  I understand that some of the potential risks of receiving the Services via telemedicine include:  Marland Kitchen Delay or interruption in medical evaluation due to technological equipment failure or disruption; . Information transmitted may not be sufficient (e.g. poor resolution of images) to allow for appropriate medical decision making by the Practitioner; and/or  . In rare instances, security protocols could fail, causing a breach of personal health information.  Furthermore, I acknowledge that it is my responsibility to provide information about my medical history, conditions and care that is complete and accurate to the best of my ability. I acknowledge that Practitioner's advice, recommendations, and/or decision may be based on factors not within their control, such as incomplete or inaccurate data provided by me or distortions of diagnostic images or  specimens that may result from electronic transmissions. I understand that the practice of medicine is not an exact science and that Practitioner makes no warranties or guarantees regarding treatment outcomes. I acknowledge that I will receive a copy of this consent concurrently upon execution via email to the email address I last provided but may also request a printed copy by calling the office of [facility].    I understand that my insurance will be billed for this visit.   I have read or had this consent read to me. . I understand the contents of this consent, which adequately explains the benefits and risks of the Services being provided via telemedicine.  . I have been provided ample opportunity to ask questions regarding this consent and the Services and have had my questions answered to my satisfaction. . I give my informed consent for the services to be provided through the use of telemedicine in my medical care  By participating in this telemedicine visit I agree to the above.

## 2018-12-30 ENCOUNTER — Other Ambulatory Visit: Payer: Self-pay

## 2018-12-30 ENCOUNTER — Ambulatory Visit (INDEPENDENT_AMBULATORY_CARE_PROVIDER_SITE_OTHER): Payer: Medicare Other | Admitting: Family

## 2018-12-30 ENCOUNTER — Encounter: Payer: Self-pay | Admitting: Family

## 2018-12-30 DIAGNOSIS — I6522 Occlusion and stenosis of left carotid artery: Secondary | ICD-10-CM

## 2018-12-30 DIAGNOSIS — S75002D Unspecified injury of femoral artery, left leg, subsequent encounter: Secondary | ICD-10-CM | POA: Diagnosis not present

## 2018-12-30 DIAGNOSIS — I6521 Occlusion and stenosis of right carotid artery: Secondary | ICD-10-CM

## 2018-12-30 DIAGNOSIS — Z95820 Peripheral vascular angioplasty status with implants and grafts: Secondary | ICD-10-CM | POA: Diagnosis not present

## 2018-12-30 NOTE — Progress Notes (Signed)
Virtual Visit via Telephone Note  I connected with Pamela Perez on 12/30/2018 using the [employer] by telephone and verified that I was speaking with the correct person using two identifiers. Patient was located at her home and accompanied by herself. I am located at the [facility] office/clinic.   The limitations of evaluation and management by telemedicine and the availability of in person appointments have been previously discussed with the patient and are documented in the patients chart. The patient expressed understanding and consented to proceed.  PCP: System, Pcp Not In, [employer] R. Vivi MartensAndrews, DO, [place]  Chief Complaint: follow up extracranial carotid artery stenosis and injury/intervention to left femoral artery  History of Present Illness: Pamela Perez is a 68 y.o. female who is s/pleft femoral to above-knee popliteal bypass after a chronic stent occlusion in her SFA on February 17, 2020by Dr. Darrick PennaFields.  She has no claudication symptoms with walking, she stays active.  Dr. Darrick PennaFields last evaluated pt on 06-03-18. At that timept had well healedleft groin and above-knee popliteal incisions. Left footwaspinkandwarm Bilateral ABIs were normal and triphasic bilaterally. Duplex ultrasound of her left lower extremity bypass graft showed wide patency with no narrowing. She was doing well status post left femoral to above-knee popliteal bypass. She was tofollow-up in 3 months with a repeat graft duplex and ABIs.   She initially presented>6 hours after falling out a golf cart which resulted initially in a left prepatellar laceration which was repaired in the ED. She then progressed to develop a large hematoma in the left distal thigh. A CTA was completed which demonstrated possible superficial femoral artery branches feeding the hematoma. The skin overlying the hematoma was tense and ischemic in appearance, so Dr. Imogene Burnhen felt immediate intervention was indicated. Dr. Imogene Burnhen  stented the Left superficial femoral artery with a covered stent to cover the feeding branches to the hematoma on 07/16/14 and then evacuated a large hematoma on 07/20/14.  Prior procedures include: 1. PTA L SFA for in-stent restenosis, Jetstream atherectomy L SFA stent (11/14/15)by Dr. Randie Heinzain 2. Washout of L thigh wound, VAC placement (07/20/14) 3. L SFA PTA+S for massive L thigh hematoma related to traumatic fall (07/16/14)by Dr. Imogene Burnhen  She denies tingling or numbness in either hand.  She denies chest pain or dyspnea, denies fever or chills.  She denies any history of stroke or TIA.   Diabetic:No Tobacco use:non-smoker  Pt meds include: Statin :yes, she takes rosuvastatin 20 mg daily, not atorvastatin  Betablocker:Yes ASA:325 mg daily Other anticoagulants/antiplatelets:no   Past Medical History:  Diagnosis Date  . Family history of adverse reaction to anesthesia    patient states brother had "problems with it" but she is unsure what it is  . Hypertension   . Peripheral vascular disease [facility](HCC)     Past Surgical History:  Procedure Laterality Date  . ABDOMINAL AORTOGRAM W/LOWER EXTREMITY Bilateral 03/12/2018   Procedure: ABDOMINAL AORTOGRAM W/LOWER EXTREMITY;  Surgeon: Sherren KernsFields, Charles E, MD;  [facility];  Service: Cardiovascular;  Laterality: Bilateral;  . APPLICATION OF WOUND VAC Left 07/20/2014   Procedure: APPLICATION OF WOUND VAC;  Surgeon: Fransisco HertzBrian L Chen, MD;  [facility];  Service: Vascular;  Laterality: Left;  . CHOLECYSTECTOMY    . FEMORAL-POPLITEAL BYPASS GRAFT Left 04/05/2018   Procedure: LEFT FEMORAL TO ABOVE THE KNEE POPLITEAL ARTERY BYPASS GRAFT USING 6MM X 80CM GORE PROPATEN GRAFT;  Surgeon: Sherren KernsFields, Charles E, MD;  [facility];  Service: Vascular;  Laterality:  Left;  . I&D EXTREMITY Left 07/20/2014   Procedure: IRRIGATION AND DEBRIDEMENT EXTREMITY;  Surgeon: Fransisco Hertz, MD;  Location: [facility] OR;  Service: Vascular;  Laterality: Left;  .  INSERTION OF ILIAC STENT Left 07/16/2014   Procedure: Left Leg Angiogram, Stenting of Left Superficial Femoral Artery, Repair of Pre-Patella Laceration, Evacuation of Left Thigh Hematoma;  Surgeon: Fransisco Hertz, MD;  Location: [facility] OR;  Service: Vascular;  Laterality: Left;  . PERIPHERAL VASCULAR CATHETERIZATION Left 11/14/2015   Procedure: Lower Extremity Angiography;  Surgeon: Maeola Harman, MD;  Location: [facility] INVASIVE CV LAB;  Service: Cardiovascular;  Laterality: Left;  . PERIPHERAL VASCULAR CATHETERIZATION Left 11/14/2015   Procedure: Peripheral Vascular Atherectomy;  Surgeon: Maeola Harman, MD;  Location: [facility] INVASIVE CV LAB;  Service: Cardiovascular;  Laterality: Left;    Current Meds  Medication Sig  . aspirin 325 MG EC tablet Take 325 mg by mouth daily.  Marland Kitchen atorvastatin (LIPITOR) 20 MG tablet Take 20 mg by mouth daily.  . Carbomer Gel Base (HYDROGEL) GEL APPLY THIN LAYER TO LEG WOUND ONCE DAILY  . lisinopril-hydrochlorothiazide (PRINZIDE,ZESTORETIC) 20-25 MG tablet Take 1 tablet by mouth daily.   . metoprolol succinate (TOPROL-XL) 100 MG 24 hr tablet Take 100 mg by mouth 2 (two) times daily.   . metroNIDAZOLE (METROCREAM) 0.75 % cream Apply 1 application topically daily.  . milk thistle 175 MG tablet Take 175 mg by mouth daily.  . Wound Dressings (ALLANTOIN) gel Apply topically as needed for wound care. Apply thin layer to leg wound once daily cover with dry gauze    12 system ROS was negative unless otherwise noted in HPI   Observations/Objective:  DATA  Carotid Duplex (12-29-18): Right Carotid: Evidence consistent with a total occlusion of the right ICA.                Non-hemodynamically significant plaque <50% noted in the CCA. The                ECA appears >50% stenosed. Left Carotid: Velocities in the left ICA are consistent with a 60-79% stenosis.               Non-hemodynamically significant plaque <50% noted in the CCA. The               ECA appears  <50% stenosed. Vertebrals:  Bilateral vertebral arteries demonstrate antegrade flow. Subclavians: Bilateral subclavian artery flow was disturbed.   Left LE Arterial Duplex (12-06-18): +----------+--------+-----+--------+--------+--------+ LEFT      PSV cm/sRatioStenosisWaveformComments +----------+--------+-----+--------+--------+--------+ CFA Distal127                  biphasic         +----------+--------+-----+--------+--------+--------+ DFA       129                  biphasic         +----------+--------+-----+--------+--------+--------+    Left Graft #1: Fem-pop +--------------------+--------+--------+--------+--------+                     PSV cm/sStenosisWaveformComments +--------------------+--------+--------+--------+--------+ Inflow              144             biphasic         +--------------------+--------+--------+--------+--------+ Proximal Anastomosis156             biphasic         +--------------------+--------+--------+--------+--------+ Proximal Graft      48  biphasic         +--------------------+--------+--------+--------+--------+ Mid Graft           41              biphasic         +--------------------+--------+--------+--------+--------+ Distal Graft        52              biphasic         +--------------------+--------+--------+--------+--------+ Distal Anastomosis  106             biphasic         +--------------------+--------+--------+--------+--------+ Outflow             50              biphasic         +--------------------+--------+--------+--------+--------+ Summary: Left: Patent left fem-pop bypass graft with no evidence of stenosis.   ABI Findings (12-06-18): +---------+------------------+-----+---------+--------+ Right    Rt Pressure (mmHg)IndexWaveform Comment  +---------+------------------+-----+---------+--------+ Brachial 139                                       +---------+------------------+-----+---------+--------+ ATA      151               1.03                   +---------+------------------+-----+---------+--------+ PTA      149               1.02 triphasic         +---------+------------------+-----+---------+--------+ DP                              triphasic         +---------+------------------+-----+---------+--------+ Great Toe122               0.84                   +---------+------------------+-----+---------+--------+  +---------+------------------+-----+---------+-------+ Left     Lt Pressure (mmHg)IndexWaveform Comment +---------+------------------+-----+---------+-------+ Brachial 146                                     +---------+------------------+-----+---------+-------+ ATA      111               0.76                  +---------+------------------+-----+---------+-------+ PTA      155               1.06 triphasic        +---------+------------------+-----+---------+-------+ DP                              biphasic         +---------+------------------+-----+---------+-------+ Great Toe128               0.88                  +---------+------------------+-----+---------+-------+  +-------+-----------+-----------+------------+------------+ ABI/TBIToday's ABIToday's TBIPrevious ABIPrevious TBI +-------+-----------+-----------+------------+------------+ Right  1.03       0.84       1.01        0.88         +-------+-----------+-----------+------------+------------+ Left  1.06       0.88       0.93        0.79         +-------+-----------+-----------+------------+------------+ Right ABIs appear essentially unchanged compared to prior study on 09/09/2018. Left ABIs appear increased compared to prior study on 09/09/2018. Summary: Right: Resting right ankle-brachial index is within normal range. No evidence of significant right  lower extremity arterial disease. The right toe-brachial index is normal. Left: Resting left ankle-brachial index is within normal range. No evidence of significant left lower extremity arterial disease. The left toe-brachial index is normal.    Assessment and Plan: Pamela Perez is a 68 y.o. female who presented after a fall leading to massive L thigh hematoma requiring placement of L SFA covered stentin 2016 by Dr. Lupita Raider manage muscular bleeding. She then required anatherectomy of L SFA stent for in-stent restenosisin 2017 by Dr. Donzetta Matters Most recently she required aleft femoral to above-knee popliteal bypass after a chronic stent occlusion in her SFA on February 17, 2020by Dr. Oneida Alar.  She does not seem to have claudication symptoms with walking. There are no signs of ischemia in her feet or legs.  Left leg arterial duplex today shows no stenosis in thefemoral to above-knee poplitealbypass graft, biphasic waveforms.  ABI's today: right and left remain normal with triphasic waveforms Bilateral TBI remain normal.  She has a left carotid bruit with no history of stroke or TIA: carotid duplex on 12-29-18 shows a total occlusion of the right ICA and 60-79% stenosis of the left ICA.   PLAN:  Based on the patient's vascular studies and examination, pt will return to clinic in 91monthswith ABI's, left LE arterial duplex and carotid duplex.  I advised her to notify us if she develops concerns re the circulation in her feet or legs.  I advised her to call 911 if she has any stroke or TIA symptoms.   Walk at least 30 minutes total daily in a safe environment     I discussed the assessment and treatment plan with the patient. The patient was provided an opportunity to ask questions and all were answered. The patient agreed with the plan and demonstrated an understanding of the instructions.   The patient was advised to call back or seek an in-person evaluation if the symptoms  worsen or if the condition fails to improve as anticipated.  I spent 10 minutes with the patient via telephone encounter.   Gabrielle Dare Nickel [facility] Office: [phone]  12/30/2018, 2:01 PM

## 2018-12-30 NOTE — Patient Instructions (Signed)

## 2019-01-04 ENCOUNTER — Other Ambulatory Visit: Payer: Self-pay

## 2019-01-04 DIAGNOSIS — I6522 Occlusion and stenosis of left carotid artery: Secondary | ICD-10-CM

## 2019-01-04 DIAGNOSIS — I739 Peripheral vascular disease, unspecified: Secondary | ICD-10-CM

## 2019-01-04 DIAGNOSIS — I6521 Occlusion and stenosis of right carotid artery: Secondary | ICD-10-CM

## 2019-08-20 IMAGING — CR DG ANG/EXT/UNI/OR LEFT
1 series · 1 of 1 positions shown · IV contrast (agent unspecified)
Comparison: None.

CLINICAL DATA: 67-year-old with left femoral to above the knee
popliteal artery bypass.

EXAM:
INTRAOPERATIVE ARTERIOGRAM
CONTRAST:  See operative report
FLUOROSCOPY TIME:  See operative report

[AP]
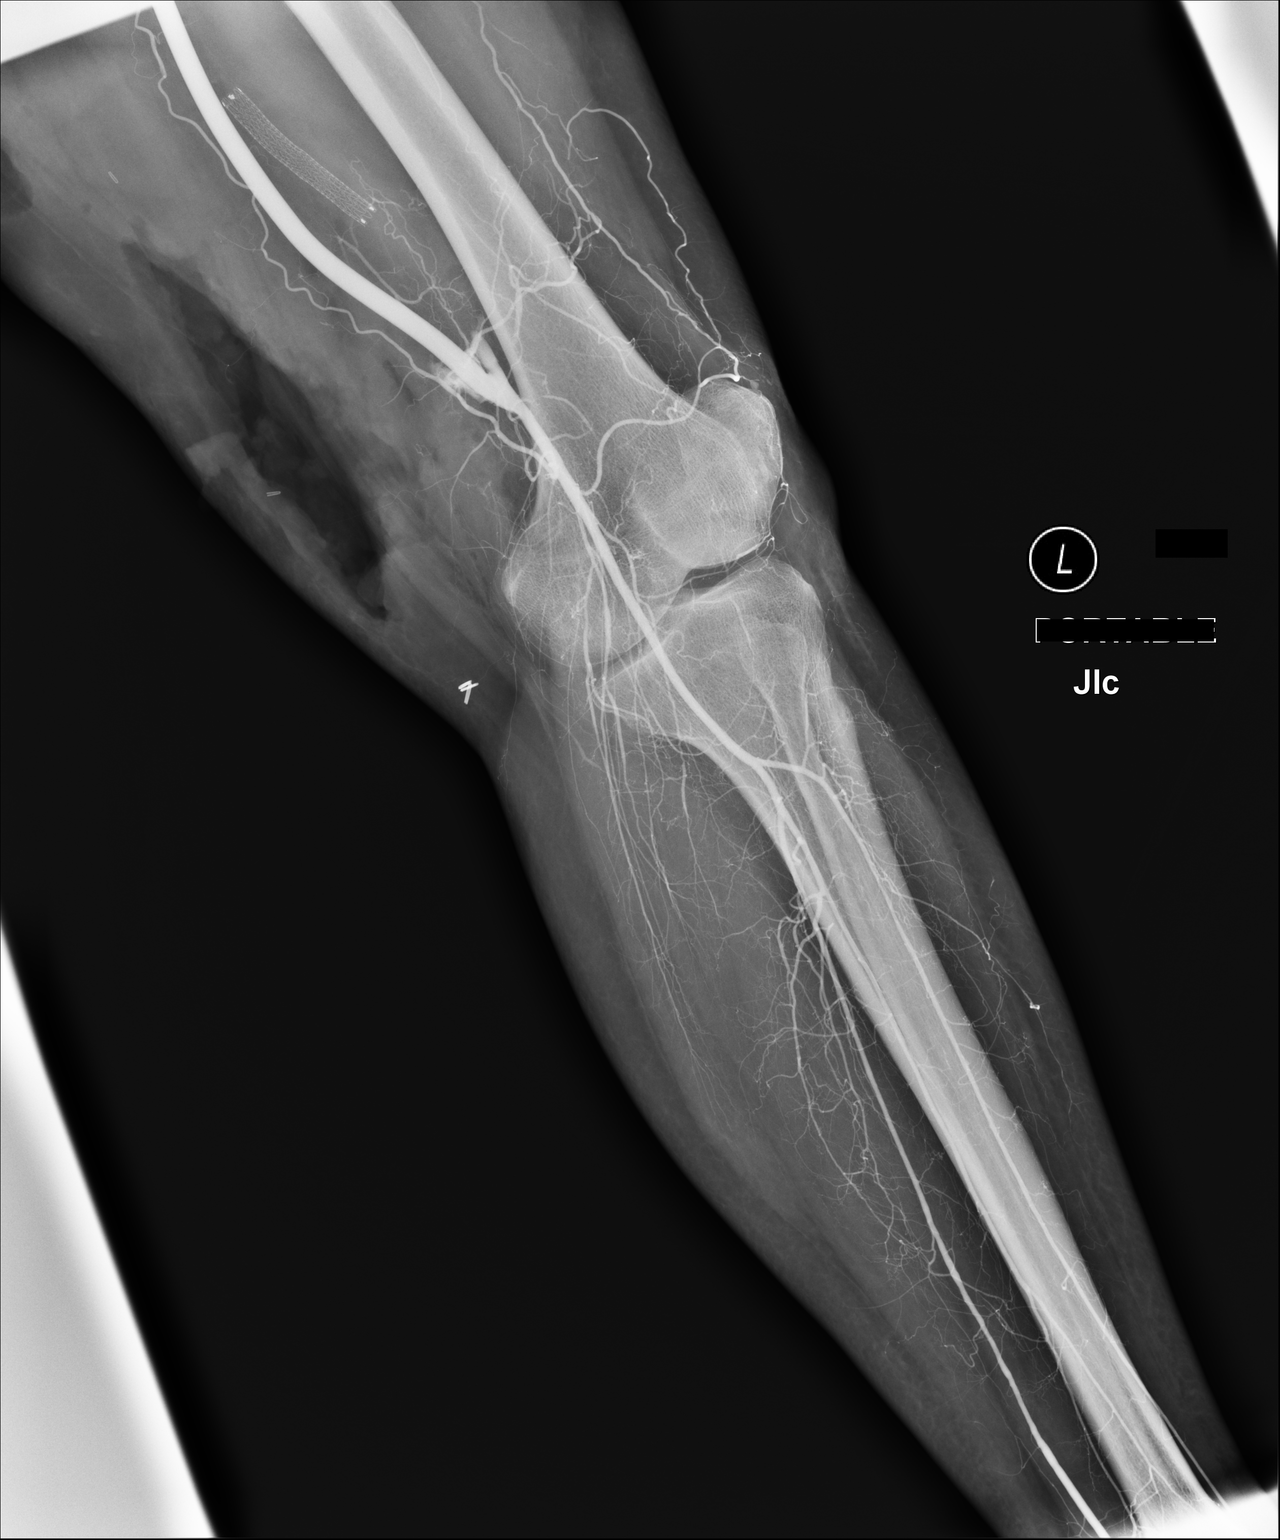

[1 of 1 positions shown; findings below may reference images not displayed]

FINDINGS: Single angiographic image of the left lower extremity was obtained.
There is an above the knee femoropopliteal bypass graft that is
patent. Graft ties into the popliteal artery above the knee. The
popliteal artery is patent. There is 3 vessel runoff to the lower
leg. Ankle is not visualized. There is a stent in the lower thigh.
Small amount of contrast around the distal anastomosis likely
related to surgical changes.
IMPRESSION: Distal aspect of the bypass graft is patent.

## 2022-11-18 DEATH — deceased
